# Patient Record
Sex: Female | Born: 1980 | Race: Black or African American | Hispanic: No | Marital: Married | Smoking: Never smoker
Health system: Southern US, Community
[De-identification: ages and names within clinical notes are randomized; demographics above are authoritative.]

## PROBLEM LIST (undated history)

## (undated) DIAGNOSIS — D219 Benign neoplasm of connective and other soft tissue, unspecified: Secondary | ICD-10-CM

## (undated) DIAGNOSIS — N970 Female infertility associated with anovulation: Secondary | ICD-10-CM

## (undated) DIAGNOSIS — E559 Vitamin D deficiency, unspecified: Secondary | ICD-10-CM

## (undated) DIAGNOSIS — E034 Atrophy of thyroid (acquired): Secondary | ICD-10-CM

## (undated) DIAGNOSIS — R87629 Unspecified abnormal cytological findings in specimens from vagina: Secondary | ICD-10-CM

## (undated) DIAGNOSIS — E049 Nontoxic goiter, unspecified: Secondary | ICD-10-CM

## (undated) DIAGNOSIS — D649 Anemia, unspecified: Secondary | ICD-10-CM

## (undated) HISTORY — DX: Nontoxic goiter, unspecified: E04.9

## (undated) HISTORY — DX: Female infertility associated with anovulation: N97.0

---

## 2012-10-07 ENCOUNTER — Other Ambulatory Visit: Payer: Self-pay | Admitting: Internal Medicine

## 2012-10-07 ENCOUNTER — Other Ambulatory Visit (INDEPENDENT_AMBULATORY_CARE_PROVIDER_SITE_OTHER): Payer: 59

## 2012-10-07 ENCOUNTER — Ambulatory Visit (INDEPENDENT_AMBULATORY_CARE_PROVIDER_SITE_OTHER): Payer: 59 | Admitting: Internal Medicine

## 2012-10-07 DIAGNOSIS — N390 Urinary tract infection, site not specified: Secondary | ICD-10-CM

## 2012-10-07 LAB — URINALYSIS, MICROSCOPIC ONLY
Casts: NONE SEEN
Crystals: NONE SEEN

## 2012-10-07 LAB — URINALYSIS, ROUTINE W REFLEX MICROSCOPIC
Bilirubin Urine: NEGATIVE
Nitrite: POSITIVE — AB
Protein, ur: >300 mg/dL — AB
Urobilinogen, UA: 1 mg/dL (ref 0.0–1.0)

## 2012-10-07 MED ORDER — PHENAZOPYRIDINE HCL 200 MG PO TABS: 200.0000 mg | ORAL_TABLET | Freq: Three times a day (TID) | ORAL | Status: DC | PRN

## 2012-10-07 MED ORDER — CIPROFLOXACIN HCL 250 MG PO TABS: 250.0000 mg | ORAL_TABLET | Freq: Two times a day (BID) | ORAL | Status: DC

## 2012-10-07 NOTE — Progress Notes (Signed)
  Subjective:    Patient ID: Kelsey Reynolds, female    DOB: April 13, 1980, 32 y.o.   MRN: [id number]  HPI  32 yo with no sig PMHx and no h/o UTI presents with 2 days of burning at the end of the urinary stream accompanied by hematuria and cloudy urine. No CVA tenderness / F/C, recent bladder catheterization, discharge. Recent travel two weeks ago to [place].   No PMHx  Meds include only OCP  Allergies are sulfa and PCN  Review of Systems     Objective:   Physical Exam        Assessment & Plan:

## 2012-10-07 NOTE — Assessment & Plan Note (Signed)
UA and micro confirm UTI. Due to travel outside of country, cx is ordered. Empiric tx with Cipro. Pyridium for sxs relief.

## 2012-10-11 ENCOUNTER — Encounter: Payer: 59 | Admitting: Internal Medicine

## 2012-10-14 ENCOUNTER — Other Ambulatory Visit: Payer: Self-pay | Admitting: Internal Medicine

## 2012-10-14 ENCOUNTER — Other Ambulatory Visit: Payer: 59

## 2012-10-14 DIAGNOSIS — N390 Urinary tract infection, site not specified: Secondary | ICD-10-CM

## 2012-10-14 LAB — URINALYSIS, ROUTINE W REFLEX MICROSCOPIC
Bilirubin Urine: NEGATIVE
Hgb urine dipstick: NEGATIVE
Ketones, ur: NEGATIVE mg/dL
Nitrite: NEGATIVE
Urobilinogen, UA: 1 mg/dL (ref 0.0–1.0)
pH: 6.5 (ref 5.0–8.0)

## 2012-10-14 LAB — URINALYSIS, MICROSCOPIC ONLY
Casts: NEGATIVE
Crystals: NEGATIVE

## 2012-10-14 MED ORDER — NITROFURANTOIN MONOHYD MACRO 100 MG PO CAPS: 100.0000 mg | ORAL_CAPSULE | Freq: Two times a day (BID) | ORAL | Status: DC

## 2012-10-14 NOTE — Addendum Note (Signed)
Addended by: Remus Blake on: 10/14/2012 01:27 PM   Modules accepted: Orders

## 2012-10-17 LAB — URINE CULTURE

## 2014-11-23 ENCOUNTER — Other Ambulatory Visit: Payer: Self-pay | Admitting: Otolaryngology

## 2014-11-23 DIAGNOSIS — E041 Nontoxic single thyroid nodule: Secondary | ICD-10-CM

## 2014-12-01 ENCOUNTER — Other Ambulatory Visit: Payer: Self-pay | Admitting: Internal Medicine

## 2014-12-01 DIAGNOSIS — E041 Nontoxic single thyroid nodule: Secondary | ICD-10-CM

## 2014-12-05 ENCOUNTER — Ambulatory Visit
Admission: RE | Admit: 2014-12-05 | Discharge: 2014-12-05 | Disposition: A | Discharge status: Home / Self Care | Payer: BLUE CROSS/BLUE SHIELD | Source: Ambulatory Visit | Attending: Internal Medicine | Admitting: Internal Medicine

## 2014-12-05 ENCOUNTER — Other Ambulatory Visit: Payer: 59

## 2014-12-05 DIAGNOSIS — E041 Nontoxic single thyroid nodule: Secondary | ICD-10-CM

## 2015-10-30 ENCOUNTER — Other Ambulatory Visit (HOSPITAL_COMMUNITY): Payer: Self-pay | Admitting: Obstetrics and Gynecology

## 2015-10-30 DIAGNOSIS — O034 Incomplete spontaneous abortion without complication: Secondary | ICD-10-CM

## 2015-10-31 ENCOUNTER — Ambulatory Visit (HOSPITAL_COMMUNITY)
Admission: RE | Admit: 2015-10-31 | Discharge: 2015-10-31 | Disposition: A | Discharge status: Home / Self Care | Payer: BLUE CROSS/BLUE SHIELD | Source: Ambulatory Visit | Attending: Obstetrics and Gynecology | Admitting: Obstetrics and Gynecology

## 2015-10-31 DIAGNOSIS — O034 Incomplete spontaneous abortion without complication: Secondary | ICD-10-CM | POA: Insufficient documentation

## 2015-10-31 DIAGNOSIS — D259 Leiomyoma of uterus, unspecified: Secondary | ICD-10-CM | POA: Insufficient documentation

## 2015-11-13 ENCOUNTER — Other Ambulatory Visit: Payer: BLUE CROSS/BLUE SHIELD

## 2015-11-14 ENCOUNTER — Other Ambulatory Visit: Payer: Self-pay | Admitting: Internal Medicine

## 2015-11-14 ENCOUNTER — Ambulatory Visit
Admission: RE | Admit: 2015-11-14 | Discharge: 2015-11-14 | Disposition: A | Discharge status: Home / Self Care | Payer: BLUE CROSS/BLUE SHIELD | Source: Ambulatory Visit | Attending: Internal Medicine | Admitting: Internal Medicine

## 2015-11-14 ENCOUNTER — Ambulatory Visit
Admission: RE | Admit: 2015-11-14 | Discharge: 2015-11-14 | Disposition: A | Discharge status: Home / Self Care | Payer: BLUE CROSS/BLUE SHIELD | Source: Ambulatory Visit | Attending: Otolaryngology | Admitting: Otolaryngology

## 2015-11-14 DIAGNOSIS — E041 Nontoxic single thyroid nodule: Secondary | ICD-10-CM

## 2016-10-24 ENCOUNTER — Inpatient Hospital Stay (HOSPITAL_COMMUNITY)
Admission: AD | Admit: 2016-10-24 | Discharge: 2016-10-26 | DRG: 770 | Disposition: A | Discharge status: Home / Self Care | Payer: BLUE CROSS/BLUE SHIELD | Source: Ambulatory Visit | Attending: Obstetrics and Gynecology | Admitting: Obstetrics and Gynecology

## 2016-10-24 ENCOUNTER — Encounter (HOSPITAL_COMMUNITY): Payer: Self-pay | Admitting: *Deleted

## 2016-10-24 ENCOUNTER — Inpatient Hospital Stay (HOSPITAL_COMMUNITY): Payer: BLUE CROSS/BLUE SHIELD

## 2016-10-24 DIAGNOSIS — O030 Genital tract and pelvic infection following incomplete spontaneous abortion: Principal | ICD-10-CM | POA: Diagnosis present

## 2016-10-24 DIAGNOSIS — Z880 Allergy status to penicillin: Secondary | ICD-10-CM | POA: Diagnosis not present

## 2016-10-24 DIAGNOSIS — R112 Nausea with vomiting, unspecified: Secondary | ICD-10-CM | POA: Diagnosis present

## 2016-10-24 DIAGNOSIS — O035 Genital tract and pelvic infection following complete or unspecified spontaneous abortion: Secondary | ICD-10-CM

## 2016-10-24 DIAGNOSIS — N939 Abnormal uterine and vaginal bleeding, unspecified: Secondary | ICD-10-CM

## 2016-10-24 HISTORY — DX: Benign neoplasm of connective and other soft tissue, unspecified: D21.9

## 2016-10-24 HISTORY — DX: Unspecified abnormal cytological findings in specimens from vagina: R87.629

## 2016-10-24 HISTORY — DX: Anemia, unspecified: D64.9

## 2016-10-24 LAB — CBC WITH DIFFERENTIAL/PLATELET
Basophils Absolute: 0 10*3/uL (ref 0.0–0.1)
Basophils Relative: 0 %
EOS ABS: 0 10*3/uL (ref 0.0–0.7)
EOS PCT: 0 %
HCT: 34 % — ABNORMAL LOW (ref 36.0–46.0)
HEMOGLOBIN: 11.5 g/dL — AB (ref 12.0–15.0)
LYMPHS ABS: 0.8 10*3/uL (ref 0.7–4.0)
Lymphocytes Relative: 5 %
MCH: 30.6 pg (ref 26.0–34.0)
MCHC: 33.8 g/dL (ref 30.0–36.0)
MCV: 90.4 fL (ref 78.0–100.0)
MONO ABS: 0.3 10*3/uL (ref 0.1–1.0)
Monocytes Relative: 2 %
NEUTROS PCT: 93 %
Neutro Abs: 14.8 10*3/uL — ABNORMAL HIGH (ref 1.7–7.7)
PLATELETS: 233 10*3/uL (ref 150–400)
RBC: 3.76 MIL/uL — ABNORMAL LOW (ref 3.87–5.11)
RDW: 12.3 % (ref 11.5–15.5)
WBC: 15.9 10*3/uL — ABNORMAL HIGH (ref 4.0–10.5)

## 2016-10-24 LAB — URINALYSIS, ROUTINE W REFLEX MICROSCOPIC
BILIRUBIN URINE: NEGATIVE
GLUCOSE, UA: NEGATIVE mg/dL
KETONES UR: 5 mg/dL — AB
Nitrite: NEGATIVE
PH: 6 (ref 5.0–8.0)
PROTEIN: 30 mg/dL — AB
Specific Gravity, Urine: 1.021 (ref 1.005–1.030)

## 2016-10-24 LAB — CREATININE, SERUM
Creatinine, Ser: 1.07 mg/dL — ABNORMAL HIGH (ref 0.44–1.00)
GFR calc non Af Amer: >60 mL/min (ref >60)

## 2016-10-24 LAB — WET PREP, GENITAL
Clue Cells Wet Prep HPF POC: NONE SEEN
Sperm: NONE SEEN
TRICH WET PREP: NONE SEEN
YEAST WET PREP: NONE SEEN

## 2016-10-24 LAB — POCT PREGNANCY, URINE: Preg Test, Ur: POSITIVE — AB

## 2016-10-24 LAB — HCG, QUANTITATIVE, PREGNANCY: hCG, Beta Chain, Quant, S: 149 m[IU]/mL — ABNORMAL HIGH (ref <5)

## 2016-10-24 MED ORDER — SODIUM CHLORIDE 0.9% FLUSH
3.0000 mL | Freq: Two times a day (BID) | INTRAVENOUS | Status: DC
  Administered 2016-10-24 – 2016-10-25 (×2): 3 mL via INTRAVENOUS

## 2016-10-24 MED ORDER — OXYCODONE-ACETAMINOPHEN 5-325 MG PO TABS: 1.0000 | ORAL_TABLET | Freq: Four times a day (QID) | ORAL | Status: DC | PRN

## 2016-10-24 MED ORDER — CLINDAMYCIN PHOSPHATE 900 MG/50ML IV SOLN: 900.0000 mg | Freq: Three times a day (TID) | INTRAVENOUS | Status: DC

## 2016-10-24 MED ORDER — ACETAMINOPHEN 325 MG PO TABS
650.0000 mg | ORAL_TABLET | ORAL | Status: DC | PRN
  Administered 2016-10-24 – 2016-10-25 (×2): 650 mg via ORAL
  Filled 2016-10-24 (×2): qty 2

## 2016-10-24 MED ORDER — GENTAMICIN SULFATE 40 MG/ML IJ SOLN
Freq: Every day | INTRAMUSCULAR | Status: DC
  Administered 2016-10-24 – 2016-10-25 (×2): via INTRAVENOUS
  Filled 2016-10-24 (×2): qty 11.25

## 2016-10-24 MED ORDER — PRENATAL MULTIVITAMIN CH
1.0000 | ORAL_TABLET | Freq: Every day | ORAL | Status: DC
  Administered 2016-10-25: 1 via ORAL
  Filled 2016-10-24: qty 1

## 2016-10-24 MED ORDER — LACTATED RINGERS IV BOLUS (SEPSIS)
1000.0000 mL | Freq: Once | INTRAVENOUS | Status: AC
  Administered 2016-10-24: 1000 mL via INTRAVENOUS

## 2016-10-24 MED ORDER — ONDANSETRON HCL 4 MG/2ML IJ SOLN: 4.0000 mg | Freq: Four times a day (QID) | INTRAMUSCULAR | Status: DC | PRN

## 2016-10-24 MED ORDER — SODIUM CHLORIDE 0.9% FLUSH: 3.0000 mL | INTRAVENOUS | Status: DC | PRN

## 2016-10-24 MED ORDER — CLINDAMYCIN PHOSPHATE 900 MG/50ML IV SOLN
900.0000 mg | INTRAVENOUS | Status: DC
  Administered 2016-10-24 – 2016-10-26 (×4): 900 mg via INTRAVENOUS
  Filled 2016-10-24 (×4): qty 50

## 2016-10-24 MED ORDER — SODIUM CHLORIDE 0.9 % IV SOLN
8.0000 mg | Freq: Once | INTRAVENOUS | Status: AC
  Administered 2016-10-24: 8 mg via INTRAVENOUS
  Filled 2016-10-24: qty 4

## 2016-10-24 MED ORDER — SODIUM CHLORIDE 0.9 % IV SOLN: 250.0000 mL | INTRAVENOUS | Status: DC | PRN

## 2016-10-24 MED ORDER — ACETAMINOPHEN 500 MG PO TABS
1000.0000 mg | ORAL_TABLET | Freq: Once | ORAL | Status: AC
  Administered 2016-10-24: 1000 mg via ORAL
  Filled 2016-10-24: qty 2

## 2016-10-24 MED ORDER — ONDANSETRON HCL 4 MG PO TABS
4.0000 mg | ORAL_TABLET | Freq: Four times a day (QID) | ORAL | Status: DC | PRN
  Administered 2016-10-25: 4 mg via ORAL
  Filled 2016-10-24: qty 1

## 2016-10-24 NOTE — MAU Provider Note (Signed)
History     CSN: [id number]  Arrival date and time: 10/24/16 6378   First Provider Initiated Contact with Patient 10/24/16 1033      Chief Complaint  Patient presents with  . Vaginal Bleeding  . Nausea   36 y.o G28P0020 female here with N/V, fever, and tachycardia since last night. Temp was 101 last night. She has not taken Tylenol. Denies abdominal or pelvic pain. No urinary sx. No respiratory sx. She was told of failed pregnancy at 8 weeks on 09/24/16 and took Cytotec on 6/30. She had increased bleeding after. She had another [place] about 2 weeks ago and was told retained POCs and she repeated the dose on 10/17/16. She is saturating through 5-8 pads per day, passing 2cm blood clots, and reports malodor.    OB History    Gravida Para Term Preterm AB Living   2 0 0 0 2 0   SAB TAB Ectopic Multiple Live Births   2              Past Medical History:  Diagnosis Date  . Anemia    with last miscarriage  . Fibroid    12 cm; with other small ones  . Vaginal Pap smear, abnormal    37 years old    No past surgical history on file.  No family history on file.  Social History  Substance Use Topics  . Smoking status: Not on file  . Smokeless tobacco: Not on file  . Alcohol use Not on file    Allergies:  Allergies  Allergen Reactions  . Penicillins   . Sulfa Antibiotics     Prescriptions Prior to Admission  Medication Sig Dispense Refill Last Dose  . nitrofurantoin, macrocrystal-monohydrate, (MACROBID) 100 MG capsule Take 1 capsule (100 mg total) by mouth 2 (two) times daily. 14 capsule 0     Review of Systems  Constitutional: Negative for chills and fever.  Gastrointestinal: Positive for nausea and vomiting. Negative for abdominal pain.  Genitourinary: Positive for vaginal discharge. Negative for pelvic pain.  Neurological: Positive for weakness.   Physical Exam   Blood pressure 115/72, pulse (!) 117, temperature 99.9 F (37.7 C), temperature source Oral, resp. rate  18, height 5' 10.47" (1.79 m), weight 141 lb (64 kg), SpO2 100 %.  Physical Exam  Constitutional: She is oriented to person, place, and time. She appears well-developed and well-nourished. No distress.  HENT:  Head: Normocephalic and atraumatic.  Neck: Normal range of motion.  Cardiovascular:  tachy  Respiratory: Effort normal. No respiratory distress.  GI: Soft. She exhibits no distension and no mass. There is no tenderness. There is no rebound and no guarding.  Genitourinary:  Genitourinary Comments: External: no lesions or erythema Vagina: rugated, pink, moist, moderate dark bloody discharge, ?POCs from os; attempted removal with ring forcep but coming in pieces and some remains in canal Uterus: + enlarged, anteverted, non tender, no CMT Adnexae: no masses, no tenderness left, no tenderness right   Musculoskeletal: Normal range of motion.  Neurological: She is alert and oriented to person, place, and time.  Skin: Skin is warm and dry.  Psychiatric: She has a normal mood and affect.   Results for orders placed or performed during the hospital encounter of 10/24/16 (from the past 24 hour(s))  Urinalysis, Routine w reflex microscopic     Status: Abnormal   Collection Time: 10/24/16  9:44 AM  Result Value Ref Range   Color, Urine YELLOW YELLOW   APPearance CLEAR  CLEAR   Specific Gravity, Urine 1.021 1.005 - 1.030   pH 6.0 5.0 - 8.0   Glucose, UA NEGATIVE NEGATIVE mg/dL   Hgb urine dipstick LARGE (A) NEGATIVE   Bilirubin Urine NEGATIVE NEGATIVE   Ketones, ur 5 (A) NEGATIVE mg/dL   Protein, ur 30 (A) NEGATIVE mg/dL   Nitrite NEGATIVE NEGATIVE   Leukocytes, UA MODERATE (A) NEGATIVE   RBC / HPF TOO NUMEROUS TO COUNT 0 - 5 RBC/hpf   WBC, UA 6-30 0 - 5 WBC/hpf   Bacteria, UA RARE (A) NONE SEEN   Squamous Epithelial / LPF 0-5 (A) NONE SEEN   Mucous PRESENT   Pregnancy, urine POC     Status: Abnormal   Collection Time: 10/24/16  9:56 AM  Result Value Ref Range   Preg Test, Ur  POSITIVE (A) NEGATIVE  Wet prep, genital     Status: Abnormal   Collection Time: 10/24/16 10:40 AM  Result Value Ref Range   Yeast Wet Prep HPF POC NONE SEEN NONE SEEN   Trich, Wet Prep NONE SEEN NONE SEEN   Clue Cells Wet Prep HPF POC NONE SEEN NONE SEEN   WBC, Wet Prep HPF POC FEW (A) NONE SEEN   Sperm NONE SEEN   CBC with Differential/Platelet     Status: Abnormal   Collection Time: 10/24/16 11:18 AM  Result Value Ref Range   WBC 15.9 (H) 4.0 - 10.5 K/uL   RBC 3.76 (L) 3.87 - 5.11 MIL/uL   Hemoglobin 11.5 (L) 12.0 - 15.0 g/dL   HCT 34.0 (L) 36.0 - 46.0 %   MCV 90.4 78.0 - 100.0 fL   MCH 30.6 26.0 - 34.0 pg   MCHC 33.8 30.0 - 36.0 g/dL   RDW 12.3 11.5 - 15.5 %   Platelets 233 150 - 400 K/uL   Neutrophils Relative % 93 %   Neutro Abs 14.8 (H) 1.7 - 7.7 K/uL   Lymphocytes Relative 5 %   Lymphs Abs 0.8 0.7 - 4.0 K/uL   Monocytes Relative 2 %   Monocytes Absolute 0.3 0.1 - 1.0 K/uL   Eosinophils Relative 0 %   Eosinophils Absolute 0.0 0.0 - 0.7 K/uL   Basophils Relative 0 %   Basophils Absolute 0.0 0.0 - 0.1 K/uL  hCG, quantitative, pregnancy     Status: Abnormal   Collection Time: 10/24/16 11:21 AM  Result Value Ref Range   hCG, Beta Chain, Quant, S 149 (H) <5 mIU/mL   US Ob Comp Less 14 Wks  Result Date: 10/24/2016 CLINICAL DATA:  Abnormal vaginal bleeding, early pregnancy, confirm spontaneous abortion at [facility] and given 2 doses of Cytotek, presenting with fever, nausea, vomiting and a heavy bleeding for 1 month ; quantitative beta HCG not yet available for correlation ; had an ultrasound performed at another facility for retained products EXAM: OBSTETRIC <14 WK [place] AND TRANSVAGINAL OB US TECHNIQUE: Both transabdominal and transvaginal ultrasound examinations were performed for complete evaluation of the gestation as well as the maternal uterus, adnexal regions, and pelvic cul-de-sac. Transvaginal technique was performed to assess early pregnancy. COMPARISON:   10/31/2015 FINDINGS: Intrauterine gestational sac: Absent Yolk sac:  N/A Embryo:  N/A Cardiac Activity: N/A Heart Rate: N/A  bpm Subchorionic hemorrhage:  N/A Maternal uterus/adnexae: Enlarged nodular uterus containing leiomyomata. Anterior leiomyoma 9.6 x 7.8 x 9.6 cm. Additional leiomyoma at LEFT lateral aspect of uterus, exophytic, 4.2 x 5.9 x 5.0 cm. Endometrial complex is obscured/distorted by the large uterine leiomyomata and not clearly delineated.  No definite endometrial fluid or evidence of a gestational sac. RIGHT ovary normal size and morphology, 3.8 x 2.4 x 3.0 cm. LEFT ovary not visualized likely obscured by bowel. No free pelvic fluid IMPRESSION: No intrauterine gestation identified. Findings are compatible with pregnancy of unknown location. Differential diagnosis includes early intrauterine pregnancy too early to visualize, spontaneous abortion, and ectopic pregnancy; no prior ultrasound for this pregnancy is available to confirm that the patient had a documented intrauterine pregnancy. If the patient has not documentation in at the current pregnancy was intrauterine, then quantitative beta HCG and or followup ultrasound would be recommended to definitively exclude ectopic pregnancy. Correlation with prior outside imaging recommended. Large uterine leiomyomata. Electronically Signed   By: Lavonia Dana M.D.   On: 10/24/2016 13:15   US Ob Transvaginal  Result Date: 10/24/2016 CLINICAL DATA:  Abnormal vaginal bleeding, early pregnancy, confirm spontaneous abortion at [facility] and given 2 doses of Cytotek, presenting with fever, nausea, vomiting and a heavy bleeding for 1 month ; quantitative beta HCG not yet available for correlation ; had an ultrasound performed at another facility for retained products EXAM: OBSTETRIC <14 WK [place] AND TRANSVAGINAL OB US TECHNIQUE: Both transabdominal and transvaginal ultrasound examinations were performed for complete evaluation of the gestation as well as  the maternal uterus, adnexal regions, and pelvic cul-de-sac. Transvaginal technique was performed to assess early pregnancy. COMPARISON:  10/31/2015 FINDINGS: Intrauterine gestational sac: Absent Yolk sac:  N/A Embryo:  N/A Cardiac Activity: N/A Heart Rate: N/A  bpm Subchorionic hemorrhage:  N/A Maternal uterus/adnexae: Enlarged nodular uterus containing leiomyomata. Anterior leiomyoma 9.6 x 7.8 x 9.6 cm. Additional leiomyoma at LEFT lateral aspect of uterus, exophytic, 4.2 x 5.9 x 5.0 cm. Endometrial complex is obscured/distorted by the large uterine leiomyomata and not clearly delineated. No definite endometrial fluid or evidence of a gestational sac. RIGHT ovary normal size and morphology, 3.8 x 2.4 x 3.0 cm. LEFT ovary not visualized likely obscured by bowel. No free pelvic fluid IMPRESSION: No intrauterine gestation identified. Findings are compatible with pregnancy of unknown location. Differential diagnosis includes early intrauterine pregnancy too early to visualize, spontaneous abortion, and ectopic pregnancy; no prior ultrasound for this pregnancy is available to confirm that the patient had a documented intrauterine pregnancy. If the patient has not documentation in at the current pregnancy was intrauterine, then quantitative beta HCG and or followup ultrasound would be recommended to definitively exclude ectopic pregnancy. Correlation with prior outside imaging recommended. Large uterine leiomyomata. Electronically Signed   By: Lavonia Dana M.D.   On: 10/24/2016 13:15   MAU Course  Procedures LR 1 L bolus Zofran Tylenol  MDM Labs and [place] ordered and reviewed.  1325: Presentation, clinical findings, labs/imaging discussed with Dr. Willis Modena. MD to come discuss management with patient.  Assessment and Plan  Endometritis following abortion Admit to [facility] per Dr. Karolee Ohs, CNM 10/24/2016, 10:50 AM

## 2016-10-24 NOTE — MAU Note (Signed)
Pt. Here due to vaginal bleeding, 5-6 pads a day. Nausea, vomiting one time this morning.  Fever of 101.4 last night, also increased HR 124, mild headache.  Ultrasound done last week for retained product (miscarriage 09/24/2016).

## 2016-10-24 NOTE — Progress Notes (Signed)
Pt temp of 103 and pulse 110. BP WNL. Pt complains of headache, rating it a 3 out of 10. Pt has moderate amount of bleeding and passed 1 nickel-sized clot. Dr. Willis Modena notified. See new orders. Will continue to monitor.

## 2016-10-24 NOTE — Progress Notes (Addendum)
ANTIBIOTIC CONSULT NOTE - INITIAL  Pharmacy Consult for Gentamicin Indication: endometritis following miscarriage  Allergies  Allergen Reactions  . Penicillins Hives    chilidhood reaction  Has patient had a PCN reaction causing immediate rash, facial/tongue/throat swelling, SOB or lightheadedness with hypotension: Yes Has patient had a PCN reaction causing severe rash involving mucus membranes or skin necrosis: No Has patient had a PCN reaction that required hospitalization: No Has patient had a PCN reaction occurring within the last 10 years: No If all of the above answers are "NO", then may proceed with Cephalosporin use.  . Sulfa Antibiotics     Patient Measurements: Height: 5\' 10"  (177.8 cm) Weight: 141 lb (64 kg) IBW/kg (Calculated) : 68.5   Vital Signs: Temp: 102.6 F (39.2 C) (07/20 1303) Temp Source: Oral (07/20 1303) BP: 127/74 (07/20 1303) Pulse Rate: 105 (07/20 1303)  Labs:  Recent Labs  10/24/16 1118  WBC 15.9*  HGB 11.5*  PLT 233   No results for input(s): GENTTROUGH, GENTPEAK, GENTRANDOM in the last 72 hours.   Microbiology: Recent Results (from the past 720 hour(s))  Wet prep, genital     Status: Abnormal   Collection Time: 10/24/16 10:40 AM  Result Value Ref Range Status   Yeast Wet Prep HPF POC NONE SEEN NONE SEEN Final   Trich, Wet Prep NONE SEEN NONE SEEN Final   Clue Cells Wet Prep HPF POC NONE SEEN NONE SEEN Final   WBC, Wet Prep HPF POC FEW (A) NONE SEEN Final    Comment: FEW BACTERIA SEEN   Sperm NONE SEEN  Final    Medications:  Clindamycin 900mg  IV every 8 hours.  Assessment: 36 y.o. female G2P0020 presenting with fever and malodorous bleeding with history of miscarriage at 8 weeks on 09/24/16.  [place] about 2 weeks ago showed retained POCs.      Goal of Therapy:  Dosing per [facility] Extended Interval Nomogram  Plan:  Gentamicin 450 mg IV daily, will check SCr at 1600 today.   Joetta Manners D 10/24/2016,2:13 PM

## 2016-10-24 NOTE — MAU Note (Signed)
Called Dr. Willis Modena, need pathology requisition in order to process specimen.

## 2016-10-24 NOTE — H&P (Signed)
History     CSN: [id number]  Arrival date and time: 10/24/16 3810   First Provider Initiated Contact with Patient 10/24/16 1033         Chief Complaint  Patient presents with  . Vaginal Bleeding  . Nausea   36 y.o G38P0020 female here with N/V, fever, and tachycardia since last night. Temp was 101 last night. She has not taken Tylenol. Denies abdominal or pelvic pain. No urinary sx. No respiratory sx. She was told of failed pregnancy at 8 weeks on 09/24/16 and took Cytotec on 6/30. She had increased bleeding after. She had another [place] about 2 weeks ago and was told retained POCs and she repeated the dose on 10/17/16. She is saturating through 5-8 pads per day, passing 2cm blood clots, and reports malodor.            OB History    Gravida Para Term Preterm AB Living   2 0 0 0 2 0   SAB TAB Ectopic Multiple Live Births   2                  Past Medical History:  Diagnosis Date  . Anemia    with last miscarriage  . Fibroid    12 cm; with other small ones  . Vaginal Pap smear, abnormal    36 years old    No past surgical history on file.  No family history on file.      Social History  Substance Use Topics  . Smoking status: Not on file  . Smokeless tobacco: Not on file  . Alcohol use Not on file    Allergies:      Allergies  Allergen Reactions  . Penicillins   . Sulfa Antibiotics            Prescriptions Prior to Admission  Medication Sig Dispense Refill Last Dose  . nitrofurantoin, macrocrystal-monohydrate, (MACROBID) 100 MG capsule Take 1 capsule (100 mg total) by mouth 2 (two) times daily. 14 capsule 0     Review of Systems  Constitutional: Negative for chills and fever.  Gastrointestinal: Positive for nausea and vomiting. Negative for abdominal pain.  Genitourinary: Positive for vaginal discharge. Negative for pelvic pain.  Neurological: Positive for weakness.   Physical Exam   Blood pressure 115/72, pulse (!)  117, temperature 99.9 F (37.7 C), temperature source Oral, resp. rate 18, height 5' 10.47" (1.79 m), weight 141 lb (64 kg), SpO2 100 %.  Physical Exam  Constitutional: She is oriented to person, place, and time. She appears well-developed and well-nourished. No distress.  HENT:  Head: Normocephalic and atraumatic.  Neck: Normal range of motion.  Cardiovascular:  tachy  Respiratory: Effort normal. No respiratory distress.  GI: Soft. She exhibits no distension and no mass. There is no tenderness. There is no rebound and no guarding.  Genitourinary:  Genitourinary Comments: External: no lesions or erythema Vagina: rugated, pink, moist, moderate dark bloody discharge, POC removed from cervix with ring forceps Uterus: slightly enlarged, anteverted, slightly tender, no CMT Adnexae: no masses, no tenderness left, no tenderness right   Musculoskeletal: Normal range of motion.  Neurological: She is alert and oriented to person, place, and time.  Skin: Skin is warm and dry.  Psychiatric: She has a normal mood and affect.        Results for orders placed or performed during the hospital encounter of 10/24/16 (from the past 24 hour(s))  Urinalysis, Routine w reflex microscopic  Status: Abnormal   Collection Time: 10/24/16  9:44 AM  Result Value Ref Range   Color, Urine YELLOW YELLOW   APPearance CLEAR CLEAR   Specific Gravity, Urine 1.021 1.005 - 1.030   pH 6.0 5.0 - 8.0   Glucose, UA NEGATIVE NEGATIVE mg/dL   Hgb urine dipstick LARGE (A) NEGATIVE   Bilirubin Urine NEGATIVE NEGATIVE   Ketones, ur 5 (A) NEGATIVE mg/dL   Protein, ur 30 (A) NEGATIVE mg/dL   Nitrite NEGATIVE NEGATIVE   Leukocytes, UA MODERATE (A) NEGATIVE   RBC / HPF TOO NUMEROUS TO COUNT 0 - 5 RBC/hpf   WBC, UA 6-30 0 - 5 WBC/hpf   Bacteria, UA RARE (A) NONE SEEN   Squamous Epithelial / LPF 0-5 (A) NONE SEEN   Mucous PRESENT   Pregnancy, urine POC     Status: Abnormal   Collection Time: 10/24/16   9:56 AM  Result Value Ref Range   Preg Test, Ur POSITIVE (A) NEGATIVE  Wet prep, genital     Status: Abnormal   Collection Time: 10/24/16 10:40 AM  Result Value Ref Range   Yeast Wet Prep HPF POC NONE SEEN NONE SEEN   Trich, Wet Prep NONE SEEN NONE SEEN   Clue Cells Wet Prep HPF POC NONE SEEN NONE SEEN   WBC, Wet Prep HPF POC FEW (A) NONE SEEN   Sperm NONE SEEN   CBC with Differential/Platelet     Status: Abnormal   Collection Time: 10/24/16 11:18 AM  Result Value Ref Range   WBC 15.9 (H) 4.0 - 10.5 K/uL   RBC 3.76 (L) 3.87 - 5.11 MIL/uL   Hemoglobin 11.5 (L) 12.0 - 15.0 g/dL   HCT 34.0 (L) 36.0 - 46.0 %   MCV 90.4 78.0 - 100.0 fL   MCH 30.6 26.0 - 34.0 pg   MCHC 33.8 30.0 - 36.0 g/dL   RDW 12.3 11.5 - 15.5 %   Platelets 233 150 - 400 K/uL   Neutrophils Relative % 93 %   Neutro Abs 14.8 (H) 1.7 - 7.7 K/uL   Lymphocytes Relative 5 %   Lymphs Abs 0.8 0.7 - 4.0 K/uL   Monocytes Relative 2 %   Monocytes Absolute 0.3 0.1 - 1.0 K/uL   Eosinophils Relative 0 %   Eosinophils Absolute 0.0 0.0 - 0.7 K/uL   Basophils Relative 0 %   Basophils Absolute 0.0 0.0 - 0.1 K/uL  hCG, quantitative, pregnancy     Status: Abnormal   Collection Time: 10/24/16 11:21 AM  Result Value Ref Range   hCG, Beta Chain, Quant, S 149 (H) <5 mIU/mL   US Ob Comp Less 14 Wks  Result Date: 10/24/2016 CLINICAL DATA:  Abnormal vaginal bleeding, early pregnancy, confirm spontaneous abortion at [facility] and given 2 doses of Cytotek, presenting with fever, nausea, vomiting and a heavy bleeding for 1 month ; quantitative beta HCG not yet available for correlation ; had an ultrasound performed at another facility for retained products EXAM: OBSTETRIC <14 WK [place] AND TRANSVAGINAL OB US TECHNIQUE: Both transabdominal and transvaginal ultrasound examinations were performed for complete evaluation of the gestation as well as the maternal uterus, adnexal regions, and pelvic cul-de-sac.  Transvaginal technique was performed to assess early pregnancy. COMPARISON:  10/31/2015 FINDINGS: Intrauterine gestational sac: Absent Yolk sac:  N/A Embryo:  N/A Cardiac Activity: N/A Heart Rate: N/A  bpm Subchorionic hemorrhage:  N/A Maternal uterus/adnexae: Enlarged nodular uterus containing leiomyomata. Anterior leiomyoma 9.6 x 7.8 x 9.6 cm. Additional leiomyoma at  LEFT lateral aspect of uterus, exophytic, 4.2 x 5.9 x 5.0 cm. Endometrial complex is obscured/distorted by the large uterine leiomyomata and not clearly delineated. No definite endometrial fluid or evidence of a gestational sac. RIGHT ovary normal size and morphology, 3.8 x 2.4 x 3.0 cm. LEFT ovary not visualized likely obscured by bowel. No free pelvic fluid IMPRESSION: No intrauterine gestation identified. Findings are compatible with pregnancy of unknown location. Differential diagnosis includes early intrauterine pregnancy too early to visualize, spontaneous abortion, and ectopic pregnancy; no prior ultrasound for this pregnancy is available to confirm that the patient had a documented intrauterine pregnancy. If the patient has not documentation in at the current pregnancy was intrauterine, then quantitative beta HCG and or followup ultrasound would be recommended to definitively exclude ectopic pregnancy. Correlation with prior outside imaging recommended. Large uterine leiomyomata. Electronically Signed   By: Lavonia Dana M.D.   On: 10/24/2016 13:15    Assessment and Plan  Endometritis following abortion Admit to [place] and [facility] since allergic to PCN, will follow temp and monitor bleeding

## 2016-10-24 NOTE — MAU Note (Signed)
Pt. Stable, transferred via WC to hi-risk OB on 3rd floor, report given to nurse. Spouse at the bedside.

## 2016-10-25 LAB — CBC WITH DIFFERENTIAL/PLATELET
BASOS PCT: 0 %
Basophils Absolute: 0 10*3/uL (ref 0.0–0.1)
EOS ABS: 0 10*3/uL (ref 0.0–0.7)
Eosinophils Relative: 0 %
HCT: 31.3 % — ABNORMAL LOW (ref 36.0–46.0)
HEMOGLOBIN: 10.7 g/dL — AB (ref 12.0–15.0)
Lymphocytes Relative: 9 %
Lymphs Abs: 0.7 10*3/uL (ref 0.7–4.0)
MCH: 31 pg (ref 26.0–34.0)
MCHC: 34.2 g/dL (ref 30.0–36.0)
MCV: 90.7 fL (ref 78.0–100.0)
MONOS PCT: 4 %
Monocytes Absolute: 0.3 10*3/uL (ref 0.1–1.0)
NEUTROS PCT: 87 %
Neutro Abs: 6.3 10*3/uL (ref 1.7–7.7)
Platelets: 187 10*3/uL (ref 150–400)
RBC: 3.45 MIL/uL — AB (ref 3.87–5.11)
RDW: 12.4 % (ref 11.5–15.5)
WBC: 7.2 10*3/uL (ref 4.0–10.5)

## 2016-10-25 MED ORDER — IBUPROFEN 600 MG PO TABS: 600.0000 mg | ORAL_TABLET | Freq: Four times a day (QID) | ORAL | Status: DC | PRN

## 2016-10-25 MED ORDER — METHYLERGONOVINE MALEATE 0.2 MG PO TABS
0.2000 mg | ORAL_TABLET | Freq: Three times a day (TID) | ORAL | Status: DC
  Administered 2016-10-25 (×3): 0.2 mg via ORAL
  Filled 2016-10-25 (×3): qty 1

## 2016-10-25 MED ORDER — DOXYCYCLINE HYCLATE 100 MG PO TABS
100.0000 mg | ORAL_TABLET | Freq: Two times a day (BID) | ORAL | Status: DC
  Administered 2016-10-25 (×2): 100 mg via ORAL
  Filled 2016-10-25 (×3): qty 1

## 2016-10-25 NOTE — Progress Notes (Signed)
Patient ID: Kelsey Reynolds, female   DOB: 1980/06/30, 36 y.o.   MRN: [id number] HD #2 post-SAB endometritis Feeling a little better, still bleeding-soaks a pad every 3-4 hrs, some dollar size clots Tmax 103 as 2250, 100.3 this am, VSS Abd- soft, NT CBC    Component Value Date/Time   WBC 7.2 10/25/2016 0610   RBC 3.45 (L) 10/25/2016 0610   HGB 10.7 (L) 10/25/2016 0610   HCT 31.3 (L) 10/25/2016 0610   PLT 187 10/25/2016 0610   MCV 90.7 10/25/2016 0610   MCH 31.0 10/25/2016 0610   MCHC 34.2 10/25/2016 0610   RDW 12.4 10/25/2016 0610   LYMPHSABS 0.7 10/25/2016 0610   MONOABS 0.3 10/25/2016 0610   EOSABS 0.0 10/25/2016 0610   BASOSABS 0.0 10/25/2016 0610   Will continue Gent and Clinda, add PO Doxycycline, add PO Methergine to help with bleeding and try to make sure everything is out of the uterus.  The u/s was not very helpful for being able to tell about retained POC due to her myomas, but I was able to remove a moderate size piece of products from cervix yesterday that made me feel like that was most, if not all, of the remaining POC.  Discussed we may still need to do D&E if her bleeding and fever do not improve. Discussed that optimally would continue IV antibiotics until afebrile for at least 24 hours.

## 2016-10-26 ENCOUNTER — Encounter (HOSPITAL_COMMUNITY)
Admission: AD | Disposition: A | Discharge status: Home / Self Care | Payer: Self-pay | Source: Ambulatory Visit | Attending: Obstetrics and Gynecology

## 2016-10-26 ENCOUNTER — Inpatient Hospital Stay (HOSPITAL_COMMUNITY): Payer: BLUE CROSS/BLUE SHIELD | Admitting: Anesthesiology

## 2016-10-26 HISTORY — PX: DILATION AND EVACUATION: SHX1459

## 2016-10-26 SURGERY — DILATION AND EVACUATION, UTERUS
Anesthesia: General | Site: Vagina

## 2016-10-26 MED ORDER — PROPOFOL 10 MG/ML IV BOLUS
INTRAVENOUS | Status: AC
  Filled 2016-10-26: qty 40

## 2016-10-26 MED ORDER — LIDOCAINE HCL (CARDIAC) 20 MG/ML IV SOLN
INTRAVENOUS | Status: DC | PRN
  Administered 2016-10-26: 50 mg via INTRAVENOUS

## 2016-10-26 MED ORDER — CLINDAMYCIN HCL 300 MG PO CAPS: 300.0000 mg | ORAL_CAPSULE | Freq: Three times a day (TID) | ORAL | 0 refills | Status: DC

## 2016-10-26 MED ORDER — FENTANYL CITRATE (PF) 100 MCG/2ML IJ SOLN
INTRAMUSCULAR | Status: AC
  Filled 2016-10-26: qty 4

## 2016-10-26 MED ORDER — DOXYCYCLINE HYCLATE 100 MG PO TABS: 100.0000 mg | ORAL_TABLET | Freq: Two times a day (BID) | ORAL | 0 refills | Status: DC

## 2016-10-26 MED ORDER — MIDAZOLAM HCL 2 MG/2ML IJ SOLN
INTRAMUSCULAR | Status: AC
  Filled 2016-10-26: qty 2

## 2016-10-26 MED ORDER — DEXAMETHASONE SODIUM PHOSPHATE 10 MG/ML IJ SOLN
INTRAMUSCULAR | Status: AC
  Filled 2016-10-26: qty 1

## 2016-10-26 MED ORDER — MIDAZOLAM HCL 2 MG/2ML IJ SOLN
INTRAMUSCULAR | Status: DC | PRN
  Administered 2016-10-26: 2 mg via INTRAVENOUS

## 2016-10-26 MED ORDER — LIDOCAINE HCL (CARDIAC) 20 MG/ML IV SOLN
INTRAVENOUS | Status: AC
  Filled 2016-10-26: qty 5

## 2016-10-26 MED ORDER — MEPERIDINE HCL 25 MG/ML IJ SOLN: 6.2500 mg | INTRAMUSCULAR | Status: DC | PRN

## 2016-10-26 MED ORDER — PROPOFOL 10 MG/ML IV BOLUS
INTRAVENOUS | Status: DC | PRN
  Administered 2016-10-26: 160 mg via INTRAVENOUS

## 2016-10-26 MED ORDER — DEXAMETHASONE SODIUM PHOSPHATE 10 MG/ML IJ SOLN
INTRAMUSCULAR | Status: DC | PRN
  Administered 2016-10-26: 10 mg via INTRAVENOUS

## 2016-10-26 MED ORDER — FENTANYL CITRATE (PF) 100 MCG/2ML IJ SOLN: 25.0000 ug | INTRAMUSCULAR | Status: DC | PRN

## 2016-10-26 MED ORDER — ONDANSETRON HCL 4 MG/2ML IJ SOLN
INTRAMUSCULAR | Status: AC
  Filled 2016-10-26: qty 2

## 2016-10-26 MED ORDER — ONDANSETRON HCL 4 MG/2ML IJ SOLN: 4.0000 mg | Freq: Once | INTRAMUSCULAR | Status: DC | PRN

## 2016-10-26 MED ORDER — GLYCOPYRROLATE 0.2 MG/ML IJ SOLN
INTRAMUSCULAR | Status: AC
  Filled 2016-10-26: qty 1

## 2016-10-26 MED ORDER — LACTATED RINGERS IV SOLN
INTRAVENOUS | Status: DC | PRN
  Administered 2016-10-26: 11:00:00 via INTRAVENOUS

## 2016-10-26 MED ORDER — FENTANYL CITRATE (PF) 100 MCG/2ML IJ SOLN
INTRAMUSCULAR | Status: DC | PRN
  Administered 2016-10-26 (×2): 50 ug via INTRAVENOUS

## 2016-10-26 MED ORDER — ONDANSETRON HCL 4 MG/2ML IJ SOLN
INTRAMUSCULAR | Status: DC | PRN
  Administered 2016-10-26: 4 mg via INTRAVENOUS

## 2016-10-26 MED ORDER — METHYLERGONOVINE MALEATE 0.2 MG PO TABS: 0.2000 mg | ORAL_TABLET | Freq: Three times a day (TID) | ORAL | 0 refills | Status: DC

## 2016-10-26 SURGICAL SUPPLY — 19 items
CATH ROBINSON RED A/P 16FR (CATHETERS) ×2 IMPLANT
CLOTH BEACON ORANGE TIMEOUT ST (SAFETY) ×2 IMPLANT
DECANTER SPIKE VIAL GLASS SM (MISCELLANEOUS) ×2 IMPLANT
GLOVE BIO SURGEON STRL SZ8 (GLOVE) ×2 IMPLANT
GLOVE BIOGEL PI IND STRL 7.0 (GLOVE) ×1 IMPLANT
GLOVE BIOGEL PI INDICATOR 7.0 (GLOVE) ×1
GLOVE ORTHO TXT STRL SZ7.5 (GLOVE) ×2 IMPLANT
GOWN STRL REUS W/TWL LRG LVL3 (GOWN DISPOSABLE) ×4 IMPLANT
KIT BERKELEY 1ST TRIMESTER 3/8 (MISCELLANEOUS) ×2 IMPLANT
NS IRRIG 1000ML POUR BTL (IV SOLUTION) ×2 IMPLANT
PACK VAGINAL MINOR WOMEN LF (CUSTOM PROCEDURE TRAY) ×2 IMPLANT
PAD OB MATERNITY 4.3X12.25 (PERSONAL CARE ITEMS) ×2 IMPLANT
PAD PREP 24X48 CUFFED NSTRL (MISCELLANEOUS) ×2 IMPLANT
SET BERKELEY SUCTION TUBING (SUCTIONS) ×2 IMPLANT
TOWEL OR 17X24 6PK STRL BLUE (TOWEL DISPOSABLE) ×4 IMPLANT
VACURETTE 10 RIGID CVD (CANNULA) IMPLANT
VACURETTE 7MM CVD STRL WRAP (CANNULA) ×2 IMPLANT
VACURETTE 8 RIGID CVD (CANNULA) IMPLANT
VACURETTE 9 RIGID CVD (CANNULA) IMPLANT

## 2016-10-26 NOTE — Anesthesia Preprocedure Evaluation (Addendum)
Anesthesia Evaluation  Patient identified by MRN, date of birth, ID band Patient awake    Reviewed: Allergy & Precautions, H&P , NPO status , Patient's Chart, lab work & pertinent test results, reviewed documented beta blocker date and time   Airway Mallampati: I  TM Distance: >3 FB Neck ROM: full    Dental no notable dental hx.    Pulmonary neg pulmonary ROS,    Pulmonary exam normal breath sounds clear to auscultation       Cardiovascular Exercise Tolerance: Good negative cardio ROS   Rhythm:regular Rate:Normal     Neuro/Psych negative neurological ROS  negative psych ROS   GI/Hepatic negative GI ROS, Neg liver ROS,   Endo/Other  negative endocrine ROS  Renal/GU negative Renal ROS  negative genitourinary   Musculoskeletal   Abdominal   Peds  Hematology negative hematology ROS (+)   Anesthesia Other Findings   Reproductive/Obstetrics negative OB ROS                            Anesthesia Physical Anesthesia Plan  ASA: I and emergent  Anesthesia Plan: General and General LMA   Post-op Pain Management:    Induction:   PONV Risk Score and Plan: 2 and Ondansetron, Dexamethasone and Treatment may vary due to age or medical condition  Airway Management Planned: LMA  Additional Equipment:   Intra-op Plan:   Post-operative Plan:   Informed Consent: I have reviewed the patients History and Physical, chart, labs and discussed the procedure including the risks, benefits and alternatives for the proposed anesthesia with the patient or authorized representative who has indicated his/her understanding and acceptance.   Dental Advisory Given  Plan Discussed with: CRNA  Anesthesia Plan Comments:        Anesthesia Quick Evaluation

## 2016-10-26 NOTE — Discharge Summary (Signed)
Physician Discharge Summary  Patient ID: Kelsey Reynolds MRN: [id number] DOB/AGE: 1980/12/21 36 y.o.  Admit date: 10/24/2016 Discharge date: 10/26/2016  Admission Diagnoses:  Endometritis s/p SAB  Discharge Diagnoses: Same Active Problems:   Endometritis following abortive pregnancy   Discharged Condition: good  Hospital Course: Pt seen in MAU with low grade fever, leukocytosis, vaginal bleeding and retained products in cervix.  Ultrasound with 2 large myomas making it difficult to evaluate endometrium.  POC removed from cervix, pt admitted and put on [place] and Doxy.  She had fever to 103 on day of admission, 102 on HD #2 but afebrile after that.  She continued to have some bleeding and was placed on PO Methergine, passed some tissue HD #2.  On morning of 7-22, she had been afebrile for almost 24 hours.  She was still bleeding some and there was concern for still retained POC.  She underwent D&E with removal of mostly blood and a small amount of tissue, stable for discharge home after procedure.   Discharge Exam: Blood pressure 108/70, pulse 84, temperature 98.3 F (36.8 C), resp. rate 16, height 5\' 10"  (1.778 m), weight 64 kg (141 lb), SpO2 97 %. General appearance: alert  Disposition: 01-Home or Self Care  Discharge Instructions    Call MD for:  difficulty breathing, headache or visual disturbances    Complete by:  As directed    Call MD for:  persistant dizziness or light-headedness    Complete by:  As directed    Call MD for:  persistant nausea and vomiting    Complete by:  As directed    Call MD for:  severe uncontrolled pain    Complete by:  As directed    Call MD for:  temperature >100.4    Complete by:  As directed    Diet - low sodium heart healthy    Complete by:  As directed    Increase activity slowly    Complete by:  As directed      Allergies as of 10/26/2016      Reactions   Penicillins Hives   chilidhood reaction Has patient had a PCN  reaction causing immediate rash, facial/tongue/throat swelling, SOB or lightheadedness with hypotension: Yes Has patient had a PCN reaction causing severe rash involving mucus membranes or skin necrosis: No Has patient had a PCN reaction that required hospitalization: No Has patient had a PCN reaction occurring within the last 10 years: No If all of the above answers are "NO", then may proceed with Cephalosporin use.   Sulfa Antibiotics       Medication List    TAKE these medications   clindamycin 300 MG capsule Commonly known as:  CLEOCIN Take 1 capsule (300 mg total) by mouth 3 (three) times daily.   doxycycline 100 MG tablet Commonly known as:  VIBRA-TABS Take 1 tablet (100 mg total) by mouth 2 (two) times daily.   methylergonovine 0.2 MG tablet Commonly known as:  METHERGINE Take 1 tablet (0.2 mg total) by mouth 3 (three) times daily.   Vitamin D3 50000 units Caps Take 1 capsule by mouth once a week.      Follow-up Information    Symeon Puleo, MD. Schedule an appointment as soon as possible for a visit in 1 week.   Specialty:  Obstetrics and Gynecology Contact information: [address], [place] [address] [place] [address] [phone]           Signed: Blane Ohara Gareth Fitzner 10/26/2016, 10:19  PM

## 2016-10-26 NOTE — Progress Notes (Signed)
Patient states she received RhoGram in the Dr's office July 11,2018

## 2016-10-26 NOTE — Transfer of Care (Signed)
Immediate Anesthesia Transfer of Care Note  Patient: Kelsey Reynolds  Procedure(s) Performed: Procedure(s): DILATATION AND EVACUATION (N/A)  Patient Location: PACU  Anesthesia Type:General  Level of Consciousness: awake, alert  and oriented  Airway & Oxygen Therapy: Patient Spontanous Breathing and Patient connected to nasal cannula oxygen  Post-op Assessment: Report given to RN and Post -op Vital signs reviewed and stable  Post vital signs: Reviewed and stable  Last Vitals:  Vitals:   10/26/16 0420 10/26/16 0804  BP: 100/63 102/67  Pulse: 83 87  Resp: 18 16  Temp: 36.9 C 36.9 C    Last Pain:  Vitals:   10/26/16 0804  TempSrc: Oral  PainSc:       Patients Stated Pain Goal: 2 (53/64/68 0321)  Complications: No apparent anesthesia complications

## 2016-10-26 NOTE — Progress Notes (Signed)
Patient ID: Kelsey Reynolds, female   DOB: [birth year], 36 y.o.   MRN: [id number] HD#3, endometritis s/p incomplete SAB Feeling ok but passed more tissue last night and still having bleeding Tmax 102.1 at 0952, afebrile almost 24 hrs, VSS Abd- soft VE-os still open, small clot removed, feels like there could still be some tissue in cervix Discussed with pt, overall course improved on Gent/Clinda/doxy and Methergine but may still have some retained POC.  Recommended D&E and she is agreeable.  Discussed procedure and risks, OR notified.

## 2016-10-26 NOTE — Progress Notes (Signed)
Pt consent signed for Dilation and Evacuation of retained product, A new IV was started on the Left Forearm with the previous IV being discontinued due to discomfort. CHG bath completed. SCD's placed and patient transferred to OR area when called.

## 2016-10-26 NOTE — Discharge Instructions (Signed)
DISCHARGE INSTRUCTIONS: D&C / D&E The following instructions have been prepared to help you care for yourself upon your return home.   Personal hygiene:  Use sanitary pads for vaginal drainage, not tampons.  Shower the day after your procedure.  NO tub baths, pools or Jacuzzis for 2-3 weeks.  Wipe front to back after using the bathroom.  Activity and limitations:  Do NOT drive or operate any equipment for 24 hours. The effects of anesthesia are still present and drowsiness may result.  Do NOT rest in bed all day.  Walking is encouraged.  Walk up and down stairs slowly.  You may resume your normal activity in one to two days or as indicated by your physician.  Sexual activity: NO intercourse for at least 2 weeks after the procedure, or as indicated by your physician.  Diet: Eat a light meal as desired this evening. You may resume your usual diet tomorrow.  Return to work: You may resume your work activities in one to two days or as indicated by your doctor.  What to expect after your surgery: Expect to have vaginal bleeding/discharge for 2-3 days and spotting for up to 10 days. It is not unusual to have soreness for up to 1-2 weeks. You may have a slight burning sensation when you urinate for the first day. Mild cramps may continue for a couple of days. You may have a regular period in 2-6 weeks.  Call your doctor for any of the following:  Excessive vaginal bleeding, saturating and changing one pad every hour.  Inability to urinate 6 hours after discharge from hospital.  Pain not relieved by pain medication.  Fever of 100.4 F or greater.  Unusual vaginal discharge or odor.   Call for an appointment:    Patients signature: ______________________  Nurses signature ________________________  Support person's signature_______________________    Post Anesthesia Home Care Instructions  Activity: Get plenty of rest for the remainder of the day. A responsible  individual must stay with you for 24 hours following the procedure.  For the next 24 hours, DO NOT: -Drive a car -Paediatric nurse -Drink alcoholic beverages -Take any medication unless instructed by your physician -Make any legal decisions or sign important papers.  Meals: Start with liquid foods such as gelatin or soup. Progress to regular foods as tolerated. Avoid greasy, spicy, heavy foods. If nausea and/or vomiting occur, drink only clear liquids until the nausea and/or vomiting subsides. Call your physician if vomiting continues.  Special Instructions/Symptoms: Your throat may feel dry or sore from the anesthesia or the breathing tube placed in your throat during surgery. If this causes discomfort, gargle with warm salt water. The discomfort should disappear within 24 hours.  If you had a scopolamine patch placed behind your ear for the management of post- operative nausea and/or vomiting:  1. The medication in the patch is effective for 72 hours, after which it should be removed.  Wrap patch in a tissue and discard in the trash. Wash hands thoroughly with soap and water. 2. You may remove the patch earlier than 72 hours if you experience unpleasant side effects which may include dry mouth, dizziness or visual disturbances. 3. Avoid touching the patch. Wash your hands with soap and water after contact with the patch.   Routine instructions for D&E

## 2016-10-26 NOTE — Anesthesia Procedure Notes (Signed)
Procedure Name: LMA Insertion Date/Time: 10/26/2016 11:34 AM Performed by: Rayvon Char Pre-anesthesia Checklist: Patient identified, Emergency Drugs available, Suction available and Patient being monitored Patient Re-evaluated:Patient Re-evaluated prior to induction Oxygen Delivery Method: Circle system utilized Preoxygenation: Pre-oxygenation with 100% oxygen Induction Type: IV induction Ventilation: Mask ventilation without difficulty LMA: LMA inserted LMA Size: 3.0 Number of attempts: 1

## 2016-10-26 NOTE — Op Note (Signed)
  Preoperative Diagnosis:  Endometritis with incomplete SAB Postop Diagnosis:  Same Procedure:  D&E Anesthesia:  Gen with LMA Findings: Cervix was 1 cm dilated, uterus was enlarged and irregular with myomas, minimal tissue obtained Specimens: Products of conception sent for routine pathology Estimated blood loss: Minimal Complications: None  Procedure in detail: The patient was taken to the operating room and placed in the dorsosupine position. General anesthesia was induced and she was placed and mobile stirrups. Perineum and vagina were prepped and draped in the usual sterile fashion, bladder drained with a red Robinson catheter. A Graves speculum was inserted in the vagina. The anterior lip of the cervix was grasped with a single-tooth tenaculum.  Cervix was gradually easily dilated to size 25 dilator. A size 7 curved suction curet was then inserted without difficulty. Suction curettage was return was performed with return of minimal products of conception. Sharp curettage was performed with which revealed good uterine cry in all quadrants and no significant tissue. Suction curettage was performed one more time which revealed minimal blood. The single-tooth tenaculum was removed from the cervix and bleeding was controlled with pressure. All instruments were removed from the vagina. The patient was taken to the recovery in stable condition after tolerating the procedure well. Counts were correct, and she had PAS hose on throughout the procedure.

## 2016-10-26 NOTE — Anesthesia Postprocedure Evaluation (Signed)
Anesthesia Post Note  Patient: Kelsey Reynolds  Procedure(s) Performed: Procedure(s) (LRB): DILATATION AND EVACUATION (N/A)     Patient location during evaluation: PACU Anesthesia Type: General Level of consciousness: awake and alert Pain management: pain level controlled Vital Signs Assessment: post-procedure vital signs reviewed and stable Respiratory status: spontaneous breathing, nonlabored ventilation, respiratory function stable and patient connected to nasal cannula oxygen Cardiovascular status: blood pressure returned to baseline and stable Postop Assessment: no signs of nausea or vomiting Anesthetic complications: no    Last Vitals:  Vitals:   10/26/16 0420 10/26/16 0804  BP: 100/63 102/67  Pulse: 83 87  Resp: 18 16  Temp: 36.9 C 36.9 C    Last Pain:  Vitals:   10/26/16 0804  TempSrc: Oral  PainSc:    Pain Goal: Patients Stated Pain Goal: 2 (10/24/16 2300)               [name]

## 2016-10-27 ENCOUNTER — Encounter (HOSPITAL_COMMUNITY): Payer: Self-pay | Admitting: Obstetrics and Gynecology

## 2016-10-27 LAB — GC/CHLAMYDIA PROBE AMP (~~LOC~~) NOT AT ARMC
Chlamydia: NEGATIVE
Neisseria Gonorrhea: NEGATIVE

## 2016-10-27 NOTE — Addendum Note (Signed)
Addendum  created 10/27/16 1455 by Lucretia Kern D, CRNA   Charge Capture section accepted

## 2016-10-28 LAB — TYPE AND SCREEN
ABO/RH(D): A NEG
ANTIBODY SCREEN: POSITIVE
DAT, IGG: NEGATIVE
UNIT DIVISION: 0
Unit division: 0

## 2016-10-28 LAB — BPAM RBC
BLOOD PRODUCT EXPIRATION DATE: 201808012359
Blood Product Expiration Date: 201808072359
Unit Type and Rh: 9500
Unit Type and Rh: 9500

## 2016-10-30 LAB — CULTURE, BLOOD (ROUTINE X 2)
CULTURE: NO GROWTH
Culture: NO GROWTH
SPECIAL REQUESTS: ADEQUATE
Special Requests: ADEQUATE

## 2016-12-18 ENCOUNTER — Other Ambulatory Visit: Payer: Self-pay | Admitting: Obstetrics and Gynecology

## 2016-12-19 NOTE — H&P (Signed)
Kelsey Reynolds is a 36 y.o. female , originally referred to me by Dr. Paula Compton , for Gel-port assisted myomectomy and possible hysteroscopy.  She was diagnosed with fibroids because of abnormal uterine bleeding. She had an MRI showing 4 large fibroids.  She developed breakthrough bleeding and had to stop the pills.  She has been having monthly periods but with heavy flow and prolonged duration.  Patient would like to preserve her childbearing potential.  Pertinent Gynecological History: Menses: flow is excessive with use of 3 pads or tampons on heaviest days Bleeding: dysfunctional uterine bleeding Contraception: none DES exposure: denies Blood transfusions: none Sexually transmitted diseases: no past history Previous GYN Procedures: LEEP  Last mammogram: normal Last pap: normal  OB History: G1P001   Menstrual History: Menarche age: 21 No LMP recorded.    Past Medical History:  Diagnosis Date  . Anemia    with last miscarriage  . Fibroid    12 cm; with other small ones  . Vaginal Pap smear, abnormal    36 years old                    Past Surgical History:  Procedure Laterality Date  . DILATION AND EVACUATION N/A 10/26/2016   Procedure: DILATATION AND EVACUATION;  Surgeon: Cheri Fowler, MD;  Location: [facility];  Service: Gynecology;  Laterality: N/A;             No family history on file. No hereditary disease.  No cancer of breast, ovary, uterus. No cutaneous leiomyomatosis or renal cell carcinoma.  Social History   Social History  . Marital status: Married    Spouse name: N/A  . Number of children: N/A  . Years of education: N/A   Occupational History  . Not on file.   Social History Main Topics  . Smoking status: Not on file  . Smokeless tobacco: Not on file  . Alcohol use Not on file  . Drug use: Unknown  . Sexual activity: Not on file   Other Topics Concern  . Not on file   Social History Narrative  . No narrative on file     Allergies  Allergen Reactions  . Penicillins Hives    chilidhood reaction  Has patient had a PCN reaction causing immediate rash, facial/tongue/throat swelling, SOB or lightheadedness with hypotension: Yes Has patient had a PCN reaction causing severe rash involving mucus membranes or skin necrosis: No Has patient had a PCN reaction that required hospitalization: No Has patient had a PCN reaction occurring within the last 10 years: No If all of the above answers are "NO", then may proceed with Cephalosporin use.  . Sulfa Antibiotics     No current facility-administered medications on file prior to encounter.    Current Outpatient Prescriptions on File Prior to Encounter  Medication Sig Dispense Refill  . Cholecalciferol (VITAMIN D3) 50000 units CAPS Take 1 capsule by mouth once a week.  1  . clindamycin (CLEOCIN) 300 MG capsule Take 1 capsule (300 mg total) by mouth 3 (three) times daily. 18 capsule 0  . doxycycline (VIBRA-TABS) 100 MG tablet Take 1 tablet (100 mg total) by mouth 2 (two) times daily. 12 tablet 0  . methylergonovine (METHERGINE) 0.2 MG tablet Take 1 tablet (0.2 mg total) by mouth 3 (three) times daily. 6 tablet 0     Review of Systems  Constitutional: Negative.   HENT: Negative.   Eyes: Negative.   Respiratory: Negative.   Cardiovascular: Negative.  Gastrointestinal: Negative.   Genitourinary: Negative.   Musculoskeletal: Negative.   Skin: Negative.   Neurological: Negative.   Endo/Heme/Allergies: Negative.   Psychiatric/Behavioral: Negative.      Physical Exam  There were no vitals taken for this visit. Constitutional: She is oriented to person, place, and time. She appears well-developed and well-nourished.  HENT:  Head: Normocephalic and atraumatic.  Nose: Nose normal.  Mouth/Throat: Oropharynx is clear and moist. No oropharyngeal exudate.  Eyes: Conjunctivae normal and EOM are normal. Pupils are equal, round, and reactive to light. No scleral  icterus.  Neck: Normal range of motion. Neck supple. No tracheal deviation present. No thyromegaly present.  Cardiovascular: Normal rate.   Respiratory: Effort normal and breath sounds normal.  GI: Soft. Bowel sounds are normal. She exhibits no distension and no mass. There is no tenderness.  Lymphadenopathy:    She has no cervical adenopathy.  Neurological: She is alert and oriented to person, place, and time. She has normal reflexes.  Skin: Skin is warm.  Psychiatric: She has a normal mood and affect. Her behavior is normal. Judgment and thought content normal.       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 Preoperative for Gel-port assisted myomectomy and possible hysteroscopy Benefits and risks of Gel -port assited myomectomy and possible hysteroscopy were discussed with the patient and her family member again.  Bowel prep instructions were given.  All of patient's questions were answered.  She verbalized understanding.  She knows that she will need a cesarean delivery for future pregnancies, and that it is recommended she does not conceive for 2-3 months for uterus to heal.

## 2016-12-22 ENCOUNTER — Other Ambulatory Visit: Payer: Self-pay | Admitting: Obstetrics and Gynecology

## 2016-12-22 DIAGNOSIS — D250 Submucous leiomyoma of uterus: Secondary | ICD-10-CM

## 2016-12-22 DIAGNOSIS — D251 Intramural leiomyoma of uterus: Secondary | ICD-10-CM

## 2016-12-23 NOTE — Patient Instructions (Addendum)
Kelsey Reynolds  12/23/2016      Your procedure is scheduled on 12-30-16 at 9:00 AM  Report to [facility].M.  Call this number if you have problems the morning of surgery:[phone]             OUR ADDRESS IS [address] , WE ARE LOCATED IN [place].    Remember:  Do not eat food or drink liquids after midnight.  Take these medicines the morning of surgery with A SIP OF WATER: None  Do not wear jewelry, make-up or nail polish.  Do not wear lotions, powders, or perfumes, or deoderant.  Do not shave 48 hours prior to surgery.  Do not bring valuables to the hospital.  [facility] is not responsible for any belongings or valuables.  Contacts, dentures or bridgework may not be worn into surgery.    For patients admitted to the hospital, discharge time will be determined by your treatment team.   Special instructions:   Please read over the following fact sheets that you were given.    [place] - Preparing for Surgery Before surgery, you can play an important role.  Because skin is not sterile, your skin needs to be as free of germs as possible.  You can reduce the number of germs on your skin by washing with CHG (chlorahexidine gluconate) soap before surgery.  CHG is an antiseptic cleaner which kills germs and bonds with the skin to continue killing germs even after washing. Please DO NOT use if you have an allergy to CHG or antibacterial soaps.  If your skin becomes reddened/irritated stop using the CHG and inform your nurse when you arrive at Short Stay. Do not shave (including legs and underarms) for at least 48 hours prior to the first CHG shower.  You may shave your face/neck. Please follow these instructions carefully:  1.  Shower with CHG Soap the night before surgery and the  morning of Surgery.  2.  If you choose to wash your hair, wash your hair first as usual with your  normal  shampoo.  3.  After  you shampoo, rinse your hair and body thoroughly to remove the  shampoo.                           4.  Use CHG as you would any other liquid soap.  You can apply chg directly  to the skin and wash                       Gently with a scrungie or clean washcloth.  5.  Apply the CHG Soap to your body ONLY FROM THE NECK DOWN.   Do not use on face/ open                           Wound or open sores. Avoid contact with eyes, ears mouth and genitals (private parts).                       Wash face,  Genitals (private parts) with your normal soap.             6.  Wash thoroughly, paying special attention to the area where your surgery  will be performed.  7.  Thoroughly rinse your body with warm water from the neck down.  8.  DO NOT shower/wash with your normal soap after using and rinsing off  the CHG Soap.                9.  Pat yourself dry with a clean towel.            10.  Wear clean pajamas.            11.  Place clean sheets on your bed the night of your first shower and do not  sleep with pets. Day of Surgery : Do not apply any lotions/deodorants the morning of surgery.  Please wear clean clothes to the hospital/surgery center.  FAILURE TO FOLLOW THESE INSTRUCTIONS MAY RESULT IN THE CANCELLATION OF YOUR SURGERY PATIENT SIGNATURE_________________________________  NURSE SIGNATURE__________________________________  ________________________________________________________________________

## 2016-12-24 ENCOUNTER — Encounter (INDEPENDENT_AMBULATORY_CARE_PROVIDER_SITE_OTHER): Payer: Self-pay

## 2016-12-24 ENCOUNTER — Encounter (HOSPITAL_COMMUNITY): Payer: Self-pay

## 2016-12-24 ENCOUNTER — Encounter (HOSPITAL_COMMUNITY)
Admission: RE | Admit: 2016-12-24 | Discharge: 2016-12-24 | Disposition: A | Discharge status: Home / Self Care | Payer: BLUE CROSS/BLUE SHIELD | Source: Ambulatory Visit | Attending: Obstetrics and Gynecology | Admitting: Obstetrics and Gynecology

## 2016-12-24 DIAGNOSIS — Z01812 Encounter for preprocedural laboratory examination: Secondary | ICD-10-CM | POA: Insufficient documentation

## 2016-12-24 DIAGNOSIS — D251 Intramural leiomyoma of uterus: Secondary | ICD-10-CM | POA: Diagnosis not present

## 2016-12-24 HISTORY — DX: Atrophy of thyroid (acquired): E03.4

## 2016-12-24 HISTORY — DX: Vitamin D deficiency, unspecified: E55.9

## 2016-12-24 LAB — PREGNANCY, URINE: PREG TEST UR: NEGATIVE

## 2016-12-24 NOTE — Progress Notes (Signed)
12-10-16 (EPIC) CBC, CMP, LIPID, SH, VIT D results from [facility] on chart.

## 2016-12-25 NOTE — Progress Notes (Signed)
Received call from [facility] [place] [facility]. Consent not signed due to pt request for added procedure.  Nikki to re-enter order for consent per Dr. Kerin Perna.

## 2016-12-29 NOTE — H&P (Signed)
Kelsey Reynolds is a 36 y.o. female , originally referred to me by Dr. Paula Compton , for Gel-port assisted myomectomy and possible hysteroscopy.  She was diagnosed with fibroids because of abnormal uterine bleeding. She had an MRI showing 4 large fibroids.  She developed breakthrough bleeding and had to stop the pills.  She has been having monthly periods but with heavy flow and prolonged duration.  Patient would like to preserve her childbearing potential.  Pertinent Gynecological History: Menses: flow is excessive with use of 3 pads or tampons on heaviest days Bleeding: dysfunctional uterine bleeding Contraception: none DES exposure: denies Blood transfusions: none Sexually transmitted diseases: no past history Previous GYN Procedures: LEEP  Last mammogram: normal Last pap: normal  OB History: G1P001   Menstrual History: Menarche age: 56 No LMP recorded.    Past Medical History:  Diagnosis Date  . Anemia    with last miscarriage  . Fibroid    12 cm; with other small ones  . Thyroid atrophy    Nodule  . Vaginal Pap smear, abnormal    36 years old  . Vitamin D deficiency                     Past Surgical History:  Procedure Laterality Date  . DILATION AND EVACUATION N/A 10/26/2016   Procedure: DILATATION AND EVACUATION;  Surgeon: Cheri Fowler, MD;  Location: [facility];  Service: Gynecology;  Laterality: N/A;             No family history on file. No hereditary disease.  No cancer of breast, ovary, uterus. No cutaneous leiomyomatosis or renal cell carcinoma.  Social History   Social History  . Marital status: Married    Spouse name: N/A  . Number of children: N/A  . Years of education: N/A   Occupational History  . Not on file.   Social History Main Topics  . Smoking status: Never Smoker  . Smokeless tobacco: Never Used  . Alcohol use Yes     Comment: Occ  . Drug use: No  . Sexual activity: Not on file   Other Topics Concern  . Not on file    Social History Narrative  . No narrative on file    Allergies  Allergen Reactions  . Penicillins Hives    chilidhood reaction Has patient had a PCN reaction causing immediate rash, facial/tongue/throat swelling, SOB or lightheadedness with hypotension: Yes Has patient had a PCN reaction causing severe rash involving mucus membranes or skin necrosis: No Has patient had a PCN reaction that required hospitalization: No Has patient had a PCN reaction occurring within the last 10 years: No If all of the above answers are "NO", then may proceed with Cephalosporin use.  . Sulfa Antibiotics Hives and Rash    Childhood reaction    No current facility-administered medications on file prior to encounter.    Current Outpatient Prescriptions on File Prior to Encounter  Medication Sig Dispense Refill  . Cholecalciferol (VITAMIN D3) 50000 units CAPS Take 1 capsule by mouth once a week.  1  . clindamycin (CLEOCIN) 300 MG capsule Take 1 capsule (300 mg total) by mouth 3 (three) times daily. (Patient not taking: Reported on 12/24/2016) 18 capsule 0  . doxycycline (VIBRA-TABS) 100 MG tablet Take 1 tablet (100 mg total) by mouth 2 (two) times daily. (Patient not taking: Reported on 12/24/2016) 12 tablet 0  . methylergonovine (METHERGINE) 0.2 MG tablet Take 1 tablet (0.2 mg total) by mouth  3 (three) times daily. (Patient not taking: Reported on 12/24/2016) 6 tablet 0     Review of Systems  Constitutional: Negative.   HENT: Negative.   Eyes: Negative.   Respiratory: Negative.   Cardiovascular: Negative.   Gastrointestinal: Negative.   Genitourinary: Negative.   Musculoskeletal: Negative.   Skin: Negative.   Neurological: Negative.   Endo/Heme/Allergies: Negative.   Psychiatric/Behavioral: Negative.      Physical Exam  LMP 12/15/2016 (Exact Date)  Constitutional: She is oriented to person, place, and time. She appears well-developed and well-nourished.  HENT:  Head: Normocephalic and  atraumatic.  Nose: Nose normal.  Mouth/Throat: Oropharynx is clear and moist. No oropharyngeal exudate.  Eyes: Conjunctivae normal and EOM are normal. Pupils are equal, round, and reactive to light. No scleral icterus.  Neck: Normal range of motion. Neck supple. No tracheal deviation present. No thyromegaly present.  Cardiovascular: Normal rate.   Respiratory: Effort normal and breath sounds normal.  GI: Soft. Bowel sounds are normal. She exhibits no distension and no mass. There is no tenderness.  Lymphadenopathy:    She has no cervical adenopathy.  Neurological: She is alert and oriented to person, place, and time. She has normal reflexes.  Skin: Skin is warm.  Psychiatric: She has a normal mood and affect. Her behavior is normal. Judgment and thought content normal.       Assessment/Plan:  Intramural 23x41x68mm and subserosal 22x21x41mm uterine myomas, causing menorrhagia and pressure sensation. Preoperative for Gel-port assisted myomectomy and possible hysteroscopy Benefits and risks of Gel -port assited myomectomy and possible hysteroscopy were discussed with the patient and her family member again.  Bowel prep instructions were given.  All of patient's questions were answered.  She verbalized understanding.  She knows that she will need a cesarean delivery for future pregnancies, and that it is recommended she does not conceive for 2-3 months for uterus to heal.

## 2016-12-30 ENCOUNTER — Ambulatory Visit (HOSPITAL_BASED_OUTPATIENT_CLINIC_OR_DEPARTMENT_OTHER): Payer: BLUE CROSS/BLUE SHIELD | Admitting: Anesthesiology

## 2016-12-30 ENCOUNTER — Encounter (HOSPITAL_BASED_OUTPATIENT_CLINIC_OR_DEPARTMENT_OTHER)
Admission: RE | Disposition: A | Discharge status: Home / Self Care | Payer: Self-pay | Source: Ambulatory Visit | Attending: Obstetrics and Gynecology

## 2016-12-30 ENCOUNTER — Encounter (HOSPITAL_BASED_OUTPATIENT_CLINIC_OR_DEPARTMENT_OTHER): Payer: Self-pay | Admitting: *Deleted

## 2016-12-30 ENCOUNTER — Ambulatory Visit (HOSPITAL_BASED_OUTPATIENT_CLINIC_OR_DEPARTMENT_OTHER)
Admission: RE | Admit: 2016-12-30 | Discharge: 2016-12-30 | Disposition: A | Discharge status: Home / Self Care | Payer: BLUE CROSS/BLUE SHIELD | Source: Ambulatory Visit | Attending: Obstetrics and Gynecology | Admitting: Obstetrics and Gynecology

## 2016-12-30 DIAGNOSIS — D251 Intramural leiomyoma of uterus: Secondary | ICD-10-CM | POA: Diagnosis not present

## 2016-12-30 DIAGNOSIS — D259 Leiomyoma of uterus, unspecified: Secondary | ICD-10-CM | POA: Diagnosis present

## 2016-12-30 DIAGNOSIS — N808 Other endometriosis: Secondary | ICD-10-CM | POA: Insufficient documentation

## 2016-12-30 HISTORY — PX: LAPAROSCOPIC GELPORT ASSISTED MYOMECTOMY: SHX6549

## 2016-12-30 HISTORY — PX: HYSTEROSCOPY: SHX211

## 2016-12-30 LAB — POCT PREGNANCY, URINE: PREG TEST UR: NEGATIVE

## 2016-12-30 LAB — ABO/RH: ABO/RH(D): A NEG

## 2016-12-30 LAB — TYPE AND SCREEN
ABO/RH(D): A NEG
ANTIBODY SCREEN: NEGATIVE

## 2016-12-30 SURGERY — LAPAROSCOPIC GELPORT ASSISTED MYOMECTOMY
Anesthesia: General | Site: Vagina

## 2016-12-30 MED ORDER — VASOPRESSIN 20 UNIT/ML IV SOLN
INTRAVENOUS | Status: DC | PRN
  Administered 2016-12-30: 10 mL via INTRAMUSCULAR
  Administered 2016-12-30: 11:00:00 via INTRAMUSCULAR

## 2016-12-30 MED ORDER — MIDAZOLAM HCL 2 MG/2ML IJ SOLN
INTRAMUSCULAR | Status: AC
  Filled 2016-12-30: qty 2

## 2016-12-30 MED ORDER — DEXAMETHASONE SODIUM PHOSPHATE 10 MG/ML IJ SOLN
INTRAMUSCULAR | Status: AC
  Filled 2016-12-30: qty 1

## 2016-12-30 MED ORDER — ONDANSETRON HCL 4 MG PO TABS: 4.0000 mg | ORAL_TABLET | Freq: Three times a day (TID) | ORAL | 0 refills | Status: DC | PRN

## 2016-12-30 MED ORDER — FENTANYL CITRATE (PF) 100 MCG/2ML IJ SOLN
INTRAMUSCULAR | Status: AC
  Filled 2016-12-30: qty 2

## 2016-12-30 MED ORDER — ROCURONIUM BROMIDE 10 MG/ML (PF) SYRINGE
PREFILLED_SYRINGE | INTRAVENOUS | Status: DC | PRN
Start: 2016-12-30 — End: 2016-12-30
  Administered 2016-12-30 (×3): 10 mg via INTRAVENOUS
  Administered 2016-12-30: 50 mg via INTRAVENOUS
  Administered 2016-12-30 (×2): 10 mg via INTRAVENOUS

## 2016-12-30 MED ORDER — KETOROLAC TROMETHAMINE 30 MG/ML IJ SOLN
INTRAMUSCULAR | Status: AC
  Filled 2016-12-30: qty 1

## 2016-12-30 MED ORDER — PROMETHAZINE HCL 25 MG/ML IJ SOLN
6.2500 mg | INTRAMUSCULAR | Status: DC | PRN
  Filled 2016-12-30: qty 1

## 2016-12-30 MED ORDER — SUGAMMADEX SODIUM 200 MG/2ML IV SOLN
INTRAVENOUS | Status: AC
  Filled 2016-12-30: qty 2

## 2016-12-30 MED ORDER — HYDROMORPHONE HCL 1 MG/ML IJ SOLN
0.2500 mg | INTRAMUSCULAR | Status: DC | PRN
  Administered 2016-12-30: 0.5 mg via INTRAVENOUS
  Filled 2016-12-30: qty 0.5

## 2016-12-30 MED ORDER — LIDOCAINE 2% (20 MG/ML) 5 ML SYRINGE
INTRAMUSCULAR | Status: DC | PRN
  Administered 2016-12-30: 100 mg via INTRAVENOUS

## 2016-12-30 MED ORDER — PROPOFOL 10 MG/ML IV BOLUS
INTRAVENOUS | Status: AC
  Filled 2016-12-30: qty 20

## 2016-12-30 MED ORDER — OXYCODONE HCL 5 MG PO TABS
5.0000 mg | ORAL_TABLET | Freq: Once | ORAL | Status: AC
  Administered 2016-12-30: 5 mg via ORAL
  Filled 2016-12-30: qty 1

## 2016-12-30 MED ORDER — LACTATED RINGERS IV SOLN
INTRAVENOUS | Status: DC
  Administered 2016-12-30 (×3): via INTRAVENOUS
  Filled 2016-12-30: qty 1000

## 2016-12-30 MED ORDER — ROCURONIUM BROMIDE 50 MG/5ML IV SOSY
PREFILLED_SYRINGE | INTRAVENOUS | Status: AC
  Filled 2016-12-30: qty 10

## 2016-12-30 MED ORDER — KETAMINE HCL 10 MG/ML IJ SOLN
INTRAMUSCULAR | Status: AC
  Filled 2016-12-30: qty 1

## 2016-12-30 MED ORDER — METHYLENE BLUE 0.5 % INJ SOLN
INTRAVENOUS | Status: DC | PRN
  Administered 2016-12-30: 3 mL

## 2016-12-30 MED ORDER — SCOPOLAMINE 1 MG/3DAYS TD PT72
1.0000 | MEDICATED_PATCH | TRANSDERMAL | Status: DC
  Filled 2016-12-30: qty 1

## 2016-12-30 MED ORDER — OXYCODONE-ACETAMINOPHEN 7.5-325 MG PO TABS: 1.0000 | ORAL_TABLET | ORAL | 0 refills | Status: DC | PRN

## 2016-12-30 MED ORDER — ONDANSETRON HCL 4 MG/2ML IJ SOLN
INTRAMUSCULAR | Status: DC | PRN
  Administered 2016-12-30: 4 mg via INTRAVENOUS

## 2016-12-30 MED ORDER — PROPOFOL 10 MG/ML IV BOLUS
INTRAVENOUS | Status: DC | PRN
  Administered 2016-12-30: 170 mg via INTRAVENOUS

## 2016-12-30 MED ORDER — GENTAMICIN SULFATE 40 MG/ML IJ SOLN
INTRAMUSCULAR | Status: AC
  Administered 2016-12-30: 310 mL via INTRAVENOUS
  Filled 2016-12-30: qty 7.75

## 2016-12-30 MED ORDER — PROPOFOL 500 MG/50ML IV EMUL
INTRAVENOUS | Status: AC
  Filled 2016-12-30: qty 100

## 2016-12-30 MED ORDER — OXYCODONE HCL 5 MG PO TABS
ORAL_TABLET | ORAL | Status: AC
  Filled 2016-12-30: qty 1

## 2016-12-30 MED ORDER — SUGAMMADEX SODIUM 200 MG/2ML IV SOLN
INTRAVENOUS | Status: DC | PRN
  Administered 2016-12-30: 200 mg via INTRAVENOUS

## 2016-12-30 MED ORDER — SCOPOLAMINE 1 MG/3DAYS TD PT72
1.0000 | MEDICATED_PATCH | TRANSDERMAL | Status: DC
  Administered 2016-12-30: 1.5 mg via TRANSDERMAL
  Filled 2016-12-30: qty 1

## 2016-12-30 MED ORDER — PROPOFOL 500 MG/50ML IV EMUL
INTRAVENOUS | Status: AC
  Filled 2016-12-30: qty 50

## 2016-12-30 MED ORDER — PROPOFOL 500 MG/50ML IV EMUL
INTRAVENOUS | Status: DC | PRN
  Administered 2016-12-30: 200 ug/kg/min via INTRAVENOUS

## 2016-12-30 MED ORDER — MIDAZOLAM HCL 2 MG/2ML IJ SOLN
INTRAMUSCULAR | Status: DC | PRN
  Administered 2016-12-30: 2 mg via INTRAVENOUS

## 2016-12-30 MED ORDER — HYDROMORPHONE HCL-NACL 0.5-0.9 MG/ML-% IV SOSY
PREFILLED_SYRINGE | INTRAVENOUS | Status: AC
  Filled 2016-12-30: qty 1

## 2016-12-30 MED ORDER — DEXAMETHASONE SODIUM PHOSPHATE 10 MG/ML IJ SOLN
INTRAMUSCULAR | Status: DC | PRN
  Administered 2016-12-30: 10 mg via INTRAVENOUS

## 2016-12-30 MED ORDER — BUPIVACAINE-EPINEPHRINE 0.5% -1:200000 IJ SOLN
INTRAMUSCULAR | Status: DC | PRN
  Administered 2016-12-30: 10 mL

## 2016-12-30 MED ORDER — ONDANSETRON HCL 4 MG/2ML IJ SOLN
INTRAMUSCULAR | Status: AC
  Filled 2016-12-30: qty 2

## 2016-12-30 MED ORDER — SODIUM CHLORIDE 0.9 % IR SOLN
Status: DC | PRN
  Administered 2016-12-30: 2000 mL

## 2016-12-30 MED ORDER — WHITE PETROLATUM GEL
Status: AC
  Filled 2016-12-30: qty 5

## 2016-12-30 MED ORDER — LIDOCAINE 2% (20 MG/ML) 5 ML SYRINGE
INTRAMUSCULAR | Status: AC
  Filled 2016-12-30: qty 5

## 2016-12-30 MED ORDER — KETOROLAC TROMETHAMINE 30 MG/ML IJ SOLN
INTRAMUSCULAR | Status: DC | PRN
  Administered 2016-12-30: 30 mg via INTRAVENOUS

## 2016-12-30 MED ORDER — SCOPOLAMINE 1 MG/3DAYS TD PT72
MEDICATED_PATCH | TRANSDERMAL | Status: AC
Start: 2016-12-30 — End: 2016-12-30
  Filled 2016-12-30: qty 1

## 2016-12-30 MED ORDER — FENTANYL CITRATE (PF) 100 MCG/2ML IJ SOLN
INTRAMUSCULAR | Status: DC | PRN
  Administered 2016-12-30: 50 ug via INTRAVENOUS
  Administered 2016-12-30: 25 ug via INTRAVENOUS
  Administered 2016-12-30: 50 ug via INTRAVENOUS
  Administered 2016-12-30 (×3): 25 ug via INTRAVENOUS

## 2016-12-30 MED ORDER — SODIUM CHLORIDE 0.9 % IV SOLN
INTRAVENOUS | Status: DC | PRN
  Administered 2016-12-30: 10 ug/kg/min via INTRAVENOUS

## 2016-12-30 SURGICAL SUPPLY — 75 items
CABLE HIGH FREQUENCY MONO STRZ (ELECTRODE) ×3 IMPLANT
CATH ROBINSON RED A/P 16FR (CATHETERS) IMPLANT
CLEANER CAUTERY TIP 5X5 PAD (MISCELLANEOUS) IMPLANT
CLOTH BEACON ORANGE TIMEOUT ST (SAFETY) ×3 IMPLANT
CONT SPEC 4OZ CLIKSEAL STRL BL (MISCELLANEOUS) ×3 IMPLANT
COVER MAYO STAND STRL (DRAPES) ×3 IMPLANT
DERMABOND ADVANCED (GAUZE/BANDAGES/DRESSINGS) ×1
DERMABOND ADVANCED .7 DNX12 (GAUZE/BANDAGES/DRESSINGS) ×2 IMPLANT
DEVICE TROCAR PUNCTURE CLOSURE (ENDOMECHANICALS) IMPLANT
DRSG OPSITE POSTOP 3X4 (GAUZE/BANDAGES/DRESSINGS) ×12 IMPLANT
DRSG OPSITE POSTOP 4X10 (GAUZE/BANDAGES/DRESSINGS) ×3 IMPLANT
DRSG TEGADERM 4X4.75 (GAUZE/BANDAGES/DRESSINGS) IMPLANT
DRSG TELFA 3X8 NADH (GAUZE/BANDAGES/DRESSINGS) IMPLANT
DURAPREP 26ML APPLICATOR (WOUND CARE) ×3 IMPLANT
ELECT BLADE 6.5 .24CM SHAFT (ELECTRODE) IMPLANT
ELECT NEEDLE TIP 2.8 STRL (NEEDLE) IMPLANT
ELECT REM PT RETURN 9FT ADLT (ELECTROSURGICAL) ×3
ELECTRODE REM PT RTRN 9FT ADLT (ELECTROSURGICAL) ×2 IMPLANT
FILTER SMOKE EVAC LAPAROSHD (FILTER) IMPLANT
GAUZE SPONGE 4X4 16PLY XRAY LF (GAUZE/BANDAGES/DRESSINGS) ×3 IMPLANT
GLOVE BIO SURGEON STRL SZ 6.5 (GLOVE) ×3 IMPLANT
GLOVE BIO SURGEON STRL SZ8 (GLOVE) ×3 IMPLANT
GLOVE BIOGEL PI IND STRL 6.5 (GLOVE) ×2 IMPLANT
GLOVE BIOGEL PI IND STRL 7.5 (GLOVE) ×6 IMPLANT
GLOVE BIOGEL PI IND STRL 8.5 (GLOVE) ×2 IMPLANT
GLOVE BIOGEL PI INDICATOR 6.5 (GLOVE) ×1
GLOVE BIOGEL PI INDICATOR 7.5 (GLOVE) ×3
GLOVE BIOGEL PI INDICATOR 8.5 (GLOVE) ×1
GOWN STRL REUS W/ TWL LRG LVL3 (GOWN DISPOSABLE) IMPLANT
GOWN STRL REUS W/TWL LRG LVL3 (GOWN DISPOSABLE) ×6 IMPLANT
GOWN STRL REUS W/TWL XL LVL3 (GOWN DISPOSABLE) ×6 IMPLANT
HOLDER FOLEY CATH W/STRAP (MISCELLANEOUS) IMPLANT
KIT RM TURNOVER CYSTO AR (KITS) ×3 IMPLANT
MANIPULATOR UTERINE 4.5 ZUMI (MISCELLANEOUS) ×3 IMPLANT
NDL SAFETY ECLIPSE 18X1.5 (NEEDLE) ×2 IMPLANT
NEEDLE HYPO 18GX1.5 SHARP (NEEDLE) ×1
NEEDLE HYPO 25X1 1.5 SAFETY (NEEDLE) ×3 IMPLANT
NEEDLE INSUFFLATION 14GA 120MM (NEEDLE) ×3 IMPLANT
NS IRRIG 500ML POUR BTL (IV SOLUTION) ×3 IMPLANT
PACK LAPAROSCOPY BASIN (CUSTOM PROCEDURE TRAY) ×3 IMPLANT
PACK TRENDGUARD 450 HYBRID PRO (MISCELLANEOUS) IMPLANT
PAD CLEANER CAUTERY TIP 5X5 (MISCELLANEOUS)
PAD OB MATERNITY 4.3X12.25 (PERSONAL CARE ITEMS) ×3 IMPLANT
PENCIL BUTTON HOLSTER BLD 10FT (ELECTRODE) IMPLANT
POUCH SPECIMEN RETRIEVAL 10MM (ENDOMECHANICALS) IMPLANT
SEPRAFILM MEMBRANE 5X6 (MISCELLANEOUS) ×6 IMPLANT
SET IRRIG TUBING LAPAROSCOPIC (IRRIGATION / IRRIGATOR) ×3 IMPLANT
SHEARS HARMONIC ACE PLUS 45CM (MISCELLANEOUS) IMPLANT
SPONGE LAP 18X18 X RAY DECT (DISPOSABLE) ×3 IMPLANT
SUT MNCRL AB 4-0 PS2 18 (SUTURE) ×3 IMPLANT
SUT PROLENE 0 CT 1 30 (SUTURE) IMPLANT
SUT VIC AB 0 CT1 36 (SUTURE) IMPLANT
SUT VIC AB 2-0 CT1 27 (SUTURE) ×4
SUT VIC AB 2-0 CT1 TAPERPNT 27 (SUTURE) ×8 IMPLANT
SUT VIC AB 2-0 CT2 27 (SUTURE) ×9 IMPLANT
SUT VIC AB 2-0 UR6 27 (SUTURE) ×3 IMPLANT
SUT VIC AB 4-0 SH 27 (SUTURE) ×2
SUT VIC AB 4-0 SH 27XANBCTRL (SUTURE) ×4 IMPLANT
SUT VICRYL 0 TIES 12 18 (SUTURE) IMPLANT
SYR 20CC LL (SYRINGE) IMPLANT
SYR 30ML LL (SYRINGE) IMPLANT
SYR 5ML LL (SYRINGE) ×3 IMPLANT
SYR CONTROL 10ML LL (SYRINGE) ×6 IMPLANT
SYRINGE 60CC LL (MISCELLANEOUS) ×3 IMPLANT
SYS LAPSCP GELPORT 120MM (MISCELLANEOUS) ×3
SYSTEM LAPSCP GELPORT 120MM (MISCELLANEOUS) ×2 IMPLANT
TAPE STRIPS DRAPE STRL (GAUZE/BANDAGES/DRESSINGS) ×12 IMPLANT
TOWEL OR 17X24 6PK STRL BLUE (TOWEL DISPOSABLE) ×6 IMPLANT
TRAY FOLEY CATH SILVER 14FR (SET/KITS/TRAYS/PACK) ×3 IMPLANT
TRENDGUARD 450 HYBRID PRO PACK (MISCELLANEOUS)
TROCAR OPTI TIP 5M 100M (ENDOMECHANICALS) ×3 IMPLANT
TROCAR XCEL DIL TIP R 11M (ENDOMECHANICALS) IMPLANT
TUBING INSUF HEATED (TUBING) ×3 IMPLANT
WARMER LAPAROSCOPE (MISCELLANEOUS) ×3 IMPLANT
WATER STERILE IRR 500ML POUR (IV SOLUTION) ×3 IMPLANT

## 2016-12-30 NOTE — Transfer of Care (Signed)
Immediate Anesthesia Transfer of Care Note  Patient: Kelsey Reynolds  Procedure(s) Performed: Procedure(s) (LRB): Laparascopy, gel port assisted myomectomy, chromotubation, lysis of adhesions, excision of endometriosis, (N/A) HYSTEROSCOPY (N/A)  Patient Location: PACU  Anesthesia Type: General  Level of Consciousness: awake, oriented, sedated and patient cooperative  Airway & Oxygen Therapy: Patient Spontanous Breathing and Patient connected to face mask oxygen  Post-op Assessment: Report given to PACU RN and Post -op Vital signs reviewed and stable  Post vital signs: Reviewed and stable  Complications: No apparent anesthesia complications  Last Vitals:  Vitals:   12/30/16 0722 12/30/16 1428  BP: 112/74 122/87  Pulse: 85 (!) 110  Resp: 17 (!) 22  Temp: 36.6 C (!) 36.4 C  SpO2: 99% 100%    Last Pain:  Vitals:   12/30/16 1428  TempSrc:   PainSc: Asleep      Patients Stated Pain Goal: 5 (12/30/16 0746)

## 2016-12-30 NOTE — Discharge Instructions (Signed)
°  Post Anesthesia Home Care Instructions  Activity: Get plenty of rest for the remainder of the day. A responsible individual must stay with you for 24 hours following the procedure.  For the next 24 hours, DO NOT: -Drive a car -Paediatric nurse -Drink alcoholic beverages -Take any medication unless instructed by your physician -Make any legal decisions or sign important papers.  Meals: Start with liquid foods such as gelatin or soup. Progress to regular foods as tolerated. Avoid greasy, spicy, heavy foods. If nausea and/or vomiting occur, drink only clear liquids until the nausea and/or vomiting subsides. Call your physician if vomiting continues.  Special Instructions/Symptoms: Your throat may feel dry or sore from the anesthesia or the breathing tube placed in your throat during surgery. If this causes discomfort, gargle with warm salt water. The discomfort should disappear within 24 hours.  If you had a scopolamine patch placed behind your ear for the management of post- operative nausea and/or vomiting:  1. The medication in the patch is effective for 72 hours, after which it should be removed.  Wrap patch in a tissue and discard in the trash. Wash hands thoroughly with soap and water. 2. You may remove the patch earlier than 72 hours if you experience unpleasant side effects which may include dry mouth, dizziness or visual disturbances. 3. Avoid touching the patch. Wash your hands with soap and water after contact with the patch.  May take motrin or ibuprofen after 7:30 pm if needed.   HOME CARE INSTRUCTIONS - LAPAROSCOPY  Wound Care: The bandaids or dressing which are placed over the skin openings may be removed the day after surgery. The incision should be kept clean and dry. The stitches do not need to be removed. Should the incision become sore, red, and swollen after the first week, check with your doctor.  Personal Hygiene: Shower the day after your procedure. Always wipe  from front to back after elimination.   Activity: Do not drive or operate any equipment today. The effects of the anesthesia are still present and drowsiness may result. Rest today, not necessarily flat bed rest, just take it easy. You may resume your normal activity in one to three days or as instructed by your physician.  Sexual Activity: You resume sexual activity as indicated by your physician_________.   Diet: Eat a light diet as desired this evening. You may resume a regular diet tomorrow.  Return to Work: Two to three days or as indicated by your doctor.  Expectations After Surgery: Your surgery will cause vaginal drainage or spotting which may continue for 2-3 days. Mild abdominal discomfort or tenderness is not unusual and some shoulder pain may also be noted which can be relieved by heating pad and sitting up.   Call Your Doctor If these Occur:  Persistent or heavy bleeding at incision site       Redness or swelling around incision       Elevation of temperature greater than 100 degrees F

## 2016-12-30 NOTE — Anesthesia Preprocedure Evaluation (Addendum)
Anesthesia Evaluation  Patient identified by MRN, date of birth, ID band Patient awake    Reviewed: Allergy & Precautions, NPO status , Patient's Chart, lab work & pertinent test results  Airway Mallampati: II  TM Distance: >3 FB Neck ROM: Full    Dental no notable dental hx. (+) Dental Advisory Given   Pulmonary neg pulmonary ROS,    Pulmonary exam normal        Cardiovascular negative cardio ROS Normal cardiovascular exam     Neuro/Psych negative neurological ROS  negative psych ROS   GI/Hepatic negative GI ROS, Neg liver ROS,   Endo/Other  negative endocrine ROS  Renal/GU negative Renal ROS  negative genitourinary   Musculoskeletal negative musculoskeletal ROS (+)   Abdominal   Peds negative pediatric ROS (+)  Hematology negative hematology ROS (+)   Anesthesia Other Findings   Reproductive/Obstetrics                             Anesthesia Physical Anesthesia Plan  ASA: I  Anesthesia Plan: General   Post-op Pain Management:    Induction:   PONV Risk Score and Plan: 4 or greater and Ondansetron, Dexamethasone, Scopolamine patch - Pre-op and Diphenhydramine  Airway Management Planned: Oral ETT  Additional Equipment:   Intra-op Plan:   Post-operative Plan: Extubation in OR  Informed Consent: I have reviewed the patients History and Physical, chart, labs and discussed the procedure including the risks, benefits and alternatives for the proposed anesthesia with the patient or authorized representative who has indicated his/her understanding and acceptance.   Dental advisory given  Plan Discussed with: CRNA, Anesthesiologist and Surgeon  Anesthesia Plan Comments: (TIVA )       Anesthesia Quick Evaluation

## 2016-12-30 NOTE — Op Note (Signed)
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 endotracheal  Complications: None  Estimated blood loss: 200 cc  Specimens: Uterine myoma, posterior uterine wall biopsy and peritoneal biopsies to pathology  Findings: On examination under anesthesia, external genitalia, Bartholin's, Skene's, and urethra were normal. The vagina was normal. The cervix was nulliparous and appeared grossly normal. The uterus was 16 week size, firm and mobile with irregularities caused by myomas. It sounded to 14 cm.  On laparoscopy, upper abdomen, liver surface and diaphragm surfaces were normal. Gallbladder was normal. The appendix appeared normal.  The uterus contained A 12 x 8 cm anterior left-sided transmural myoma the posterior and right aspect of which was attached to the endometrium, which was entered during the myomectomy. There was also a 4 x 4 centimeter posterior myoma with the surrounding myometrial changes suggestive of adenomyosis.  This myoma was also removed and the posterior wall of the uterus was separately biopsied and the specimen was submitted to pathology.The left tube and ovary appeared normal. The right tube appeared normal.  The pelvic peritoneum looked mostly normal but there was a posterior cul-de-sac stellate lesion suggestive of endometriosis which was excised.    Description of the procedure: The patient was placed in dorsal supine position and general endotracheal anesthesia was given. 2 g of cefazolin were given intravenously for prophylaxis. Patient was placed in lithotomy position. She was prepped and draped in  sterile manner. A Foley catheter was inserted into the bladder.. A ZUMI catheter was placed into the uterine cavity. This was connected to a syringe containing diluted methylene blue solution which was used to define the endometrium during the myomectomy. The uterus sounded to 14 cm The surgeon was regloved and a surgical field was created on the abdomen.  After preemptive anesthesia of all surgical sites with 0.5% bupivacaine, a 5 mm intraumbilical skin incision was made and a Verress needle was inserted. Its correct location was confirmed. A pneumoperitoneum was created with carbon dioxide.  5 mm laparoscope with a 30 lens was inserted and video laparoscopy was started . A right lower quadrant 5 mm Incision wasmade and ancillary trochar was placed under direct visualization. Above findings were noted.  A dilute solution of vasopressin (0.4 units per mL) was injected into the myometrium overlying the fundal myoma, until the myometrium blanched. A needle electrode with 49 W of cutting current  was used to make a vertical incision on the myometrium overlying the fundal 12 cm myoma. The myoma was grasped with tenaculum and dissection was started.  We then made a 3 cm transverse suprapubic incision to insert a GelPort. After dissection of the anatomic layers, the peritoneal cavity was entered. A GelPort was placed and the rest of the case was performed either using this port as a laparoscopic port or as a minilaparotomy port.  The rest of the myoma had to be in situ morcellated to eventually take it out of this 3 cm incision. This was carried out by blunt and sharp dissection and in situ morcellation was performed with #10 blade. The endometrial cavity was entered at the lower aspect of the 12 cm myoma. This was detected by the egress of methylene blue solution injected transcervically.  The myoma defect was closed in 4 layers:  The first layer was a deep myometrial suture of 2-0 Vicryl continuous interlocking  suture, the second and third layers were a superficial myometrial layer of 2-0 Vicryl continuous suture. A 4-0 Vicryl continuous suture was placed on the serosa and the most superficial myometrium.  Attention was then turned to the posterior myoma and this myoma was dissected free of the posterior wall of the uterus.  The surrounding myometrium was noted to be thickened and of adenomyotic texture and this portion of the myometrium was separately biopsied and sent to pathology. The myoma defect was separately closed in 3 layers, in a similar fashion to the closure of the defect of the larger myoma. The suprapubic fascial incision was closed with 2-0 Vicryl continuous suture. Subcutaneous tissue was irrigated and aspirated good hemostasis was achieved. The abdomen and the pelvis was carefully inspected under laparoscopic visualization and the pelvis was copiously irrigated and aspirated. A slurry of 2 sheets of Seprafilm in 60 mL of normal saline was injected as an adhesion barrier into the pelvis. The gas was allowed to escape. The instrument and the lap pad count were correct. The trochars were removed. The skin incisions were approximated with 4-0 Monocryl in subcuticular sutures, including the 3 cm skin incision that belonged to the Bramwell site. The surgeon then was positioned between the patient's legs to perform a hysteroscopy.  A vaginal speculum was inserted and the cervix was grasped with a tenaculum.  A SlimLine hysteroscope with a 12 lens was inserted into the cervical canal and secured hysteroscopy was started.  Hysteroscopic distention medium was saline and the distention method was gravity. And normal uterine cavity configuration was confirmed.  The endometrium was disrupted due to the use of the ZUMI catheter.( endometrial stitches were visualized.  Both tubal ostial regions were visualized.  The procedure was then terminated.  The patient tolerated the procedure well and was transferred to  recovery room in satisfactory condition.  SPECIAL NOTE: Because of the extent of the myometrial incision during the uterine myomectomy, it is recommended that this patient deliver by a cesarean section with her future pregnancies.  Governor Specking, MD

## 2016-12-30 NOTE — Anesthesia Postprocedure Evaluation (Signed)
Anesthesia Post Note  Patient: Rianne Degraaf McLean-Scocuzza  Procedure(s) Performed: Procedure(s) (LRB): Laparascopy, gel port assisted myomectomy, chromotubation, lysis of adhesions, excision of endometriosis, (N/A) HYSTEROSCOPY (N/A)     Patient location during evaluation: PACU Anesthesia Type: General Level of consciousness: sedated Pain management: pain level controlled Vital Signs Assessment: post-procedure vital signs reviewed and stable Respiratory status: spontaneous breathing and respiratory function stable Cardiovascular status: stable Postop Assessment: no apparent nausea or vomiting Anesthetic complications: no    Last Vitals:  Vitals:   12/30/16 1545 12/30/16 1636  BP: 130/83 125/79  Pulse: 74 71  Resp: 10 16  Temp:  36.4 C  SpO2: 100% 100%    Last Pain:  Vitals:   12/30/16 1615  TempSrc:   PainSc: 4                  Dorrian Doggett DANIEL

## 2016-12-30 NOTE — Anesthesia Procedure Notes (Signed)
Procedure Name: Intubation Date/Time: 12/30/2016 9:59 AM Performed by: Denna Haggard D Pre-anesthesia Checklist: Patient identified, Emergency Drugs available, Suction available and Patient being monitored Patient Re-evaluated:Patient Re-evaluated prior to induction Oxygen Delivery Method: Circle system utilized Preoxygenation: Pre-oxygenation with 100% oxygen Induction Type: IV induction Ventilation: Mask ventilation without difficulty Laryngoscope Size: Mac and 3 Grade View: Grade I Tube type: Oral Tube size: 7.0 mm Number of attempts: 1 Airway Equipment and Method: Stylet and Oral airway Placement Confirmation: ETT inserted through vocal cords under direct vision,  positive ETCO2 and breath sounds checked- equal and bilateral Secured at: 22 cm Tube secured with: Tape Dental Injury: Teeth and Oropharynx as per pre-operative assessment

## 2016-12-31 ENCOUNTER — Encounter (HOSPITAL_BASED_OUTPATIENT_CLINIC_OR_DEPARTMENT_OTHER): Payer: Self-pay | Admitting: Obstetrics and Gynecology

## 2017-01-11 ENCOUNTER — Emergency Department (HOSPITAL_COMMUNITY): Payer: BLUE CROSS/BLUE SHIELD

## 2017-01-11 ENCOUNTER — Encounter (HOSPITAL_COMMUNITY): Payer: Self-pay | Admitting: Emergency Medicine

## 2017-01-11 ENCOUNTER — Emergency Department (HOSPITAL_COMMUNITY)
Admission: EM | Admit: 2017-01-11 | Discharge: 2017-01-11 | Disposition: A | Discharge status: Home / Self Care | Payer: BLUE CROSS/BLUE SHIELD | Attending: Emergency Medicine | Admitting: Emergency Medicine

## 2017-01-11 DIAGNOSIS — K5900 Constipation, unspecified: Secondary | ICD-10-CM | POA: Insufficient documentation

## 2017-01-11 DIAGNOSIS — Z79899 Other long term (current) drug therapy: Secondary | ICD-10-CM | POA: Insufficient documentation

## 2017-01-11 DIAGNOSIS — R109 Unspecified abdominal pain: Secondary | ICD-10-CM | POA: Diagnosis not present

## 2017-01-11 DIAGNOSIS — K5641 Fecal impaction: Secondary | ICD-10-CM

## 2017-01-11 MED ORDER — FLEET ENEMA 7-19 GM/118ML RE ENEM
1.0000 | ENEMA | Freq: Once | RECTAL | Status: DC
Start: 2017-01-11 — End: 2017-01-11

## 2017-01-11 NOTE — ED Provider Notes (Signed)
[facility] DEPT Provider Note   CSN: [id number] Arrival date & time: 01/11/17  0443     History   Chief Complaint Chief Complaint  Patient presents with  . Constipation  . Post-op Problem    HPI Kelsey Fetting Reynolds is a 36 y.o. female.  Patient is a 36 year old female who presents with complaints of constipation. She reports not having a significant bowel movement for the past week. She is 2 weeks status post myomectomy for uterine fibroids. She reports she is passing gas, however feels fecal impacted. She was able to manually disimpact herself somewhat at home, however still feels bloated and uncomfortable. She denies any vomiting, fevers or chills. She has tried taking magnesium citrate and senna at home.   The history is provided by the patient.  Constipation   This is a new problem. Associated symptoms include abdominal pain. She has tried nothing for the symptoms. The treatment provided no relief.    Past Medical History:  Diagnosis Date  . Anemia    with last miscarriage  . Fibroid    12 cm; with other small ones  . Thyroid atrophy    Nodule  . Vaginal Pap smear, abnormal    36 years old  . Vitamin D deficiency     Patient Active Problem List   Diagnosis Date Noted  . Endometritis following abortive pregnancy 10/24/2016  . UTI (urinary tract infection) 10/07/2012    Past Surgical History:  Procedure Laterality Date  . DILATION AND EVACUATION N/A 10/26/2016   Procedure: DILATATION AND EVACUATION;  Surgeon: Cheri Fowler, MD;  Location: [facility];  Service: Gynecology;  Laterality: N/A;  . HYSTEROSCOPY N/A 12/30/2016   Procedure: HYSTEROSCOPY;  Surgeon: Governor Specking, MD;  Location: [facility];  Service: Gynecology;  Laterality: N/A;  . LAPAROSCOPIC GELPORT ASSISTED MYOMECTOMY N/A 12/30/2016   Procedure: Laparascopy, gel port assisted myomectomy, chromotubation, lysis of adhesions, excision of endometriosis,;  Surgeon: Governor Specking, MD;  Location: [facility];  Service: Gynecology;  Laterality: N/A;    OB History    Gravida Para Term Preterm AB Living   2 0 0 0 2 0   SAB TAB Ectopic Multiple Live Births   2               Home Medications    Prior to Admission medications   Medication Sig Start Date End Date Taking? Authorizing Provider  Cholecalciferol (VITAMIN D3) 50000 units CAPS Take 1 capsule by mouth once a week. 07/22/16   [provider]  clindamycin (CLEOCIN) 300 MG capsule Take 1 capsule (300 mg total) by mouth 3 (three) times daily. Patient not taking: Reported on 12/24/2016 10/26/16   Meisinger, Sherren Mocha, MD  doxycycline (VIBRA-TABS) 100 MG tablet Take 1 tablet (100 mg total) by mouth 2 (two) times daily. Patient not taking: Reported on 12/24/2016 10/26/16   Meisinger, Sherren Mocha, MD  influenza vac recom quadrivalent (FLUBLOK) 0.5 ML injection Inject 0.5 mLs into the muscle once.    [provider]  methylergonovine (METHERGINE) 0.2 MG tablet Take 1 tablet (0.2 mg total) by mouth 3 (three) times daily. Patient not taking: Reported on 12/24/2016 10/26/16   Meisinger, Sherren Mocha, MD  ondansetron (ZOFRAN) 4 MG tablet Take 1 tablet (4 mg total) by mouth every 8 (eight) hours as needed for nausea or vomiting. 12/30/16   Governor Specking, MD  oxyCODONE-acetaminophen (PERCOCET) 7.5-325 MG tablet Take 1 tablet by mouth every 4 (four) hours as needed. 12/30/16   Governor Specking,  MD    Family History History reviewed. No pertinent family history.  Social History Social History  Substance Use Topics  . Smoking status: Never Smoker  . Smokeless tobacco: Never Used  . Alcohol use Yes     Comment: Occ     Allergies   Penicillins and Sulfa antibiotics   Review of Systems Review of Systems  Gastrointestinal: Positive for abdominal pain and constipation.  All other systems reviewed and are negative.    Physical Exam Updated Vital Signs BP 121/80 (BP Location: Left Arm)   Pulse  92   Temp 98.2 F (36.8 C) (Oral)   Resp 16   Ht 5\' 10"  (1.778 m)   Wt 61.2 kg (135 lb)   LMP 12/15/2016 (Exact Date)   SpO2 100%   BMI 19.37 kg/m   Physical Exam  Constitutional: She is oriented to person, place, and time. She appears well-developed and well-nourished. No distress.  HENT:  Head: Normocephalic and atraumatic.  Neck: Normal range of motion. Neck supple.  Cardiovascular: Normal rate and regular rhythm.  Exam reveals no gallop and no friction rub.   No murmur heard. Pulmonary/Chest: Effort normal and breath sounds normal. No respiratory distress. She has no wheezes.  Abdominal: Soft. Bowel sounds are normal. She exhibits no distension. There is tenderness. There is no rebound and no guarding.  The surgical incisions appear clean, dry, and intact. The Steri-Strips are in place with no significant drainage. The abdominal exam is essentially benign. She does have mild generalized tenderness, however no focal tenderness or peritoneal signs.  Musculoskeletal: Normal range of motion.  Neurological: She is alert and oriented to person, place, and time.  Skin: Skin is warm and dry. She is not diaphoretic.  Nursing note and vitals reviewed.    ED Treatments / Results  Labs (all labs ordered are listed, but only abnormal results are displayed) Labs Reviewed - No data to display  EKG  EKG Interpretation None       Radiology No results found.  Procedures Procedures (including critical care time)  Medications Ordered in ED Medications - No data to display   Initial Impression / Assessment and Plan / ED Course  I have reviewed the triage vital signs and the nursing notes.  Pertinent labs & imaging results that were available during my care of the patient were reviewed by me and considered in my medical decision making (see chart for details).  Acute abdominal series reveals stool filled colon, however no evidence for obstruction. She was given a soapsuds enema  with good results. She is now feeling better and wants to go home. I will recommend magnesium citrate and return as needed for any problems.  Final Clinical Impressions(s) / ED Diagnoses   Final diagnoses:  None    New Prescriptions New Prescriptions   No medications on file     Veryl Speak, MD 01/11/17 [phone]

## 2017-01-11 NOTE — ED Triage Notes (Signed)
Pt reports being post op day 11 from abd surgery and has been having constipation without any relief from OTC medications. Pt states she did digital disimpaction and was able to get small amount out but still feels uncomfortable.

## 2017-01-11 NOTE — Discharge Instructions (Signed)
Magnesium citrate: Drank the entire 10 ounce bottle mixed with equal parts Sprite or Gatorade for relief of constipation.  Return to the emergency department if you develop worsening pain, high fevers, bloody stools, or other new and concerning symptoms.

## 2017-03-11 DIAGNOSIS — D251 Intramural leiomyoma of uterus: Secondary | ICD-10-CM | POA: Diagnosis not present

## 2017-03-11 DIAGNOSIS — E288 Other ovarian dysfunction: Secondary | ICD-10-CM | POA: Diagnosis not present

## 2017-03-11 DIAGNOSIS — D250 Submucous leiomyoma of uterus: Secondary | ICD-10-CM | POA: Diagnosis not present

## 2017-03-11 DIAGNOSIS — Z3201 Encounter for pregnancy test, result positive: Secondary | ICD-10-CM | POA: Diagnosis not present

## 2017-03-11 DIAGNOSIS — Z3200 Encounter for pregnancy test, result unknown: Secondary | ICD-10-CM | POA: Diagnosis not present

## 2017-03-11 DIAGNOSIS — Z319 Encounter for procreative management, unspecified: Secondary | ICD-10-CM | POA: Diagnosis not present

## 2017-03-11 DIAGNOSIS — Z31430 Encounter of female for testing for genetic disease carrier status for procreative management: Secondary | ICD-10-CM | POA: Diagnosis not present

## 2017-04-17 DIAGNOSIS — Z3141 Encounter for fertility testing: Secondary | ICD-10-CM | POA: Diagnosis not present

## 2017-04-17 DIAGNOSIS — Z319 Encounter for procreative management, unspecified: Secondary | ICD-10-CM | POA: Diagnosis not present

## 2017-04-17 DIAGNOSIS — N8500 Endometrial hyperplasia, unspecified: Secondary | ICD-10-CM | POA: Diagnosis not present

## 2017-04-17 DIAGNOSIS — Z113 Encounter for screening for infections with a predominantly sexual mode of transmission: Secondary | ICD-10-CM | POA: Diagnosis not present

## 2017-04-22 MED FILL — METHYLPREDNISOLONE 16 MG TA: 16 | 4 days supply | Qty: 4 | Fill #0

## 2017-04-22 MED FILL — CETROTIDE 0.25 MG KIT: 0.25 | 4 days supply | Qty: 4 | Fill #0

## 2017-04-22 MED FILL — SHARPS COLLECTOR 1.4QT: 30 days supply | Qty: 1 | Fill #0

## 2017-04-22 MED FILL — DOXYCYCLINE HYCLATE 100 MG: 100 | 20 days supply | Qty: 40 | Fill #0

## 2017-04-22 MED FILL — BD NEEDLES 22GX1.5: 22G X 1-1/2 | 30 days supply | Qty: 30 | Fill #0

## 2017-04-22 MED FILL — PREGNYL 10,000 UNITS VIAL: 10000 | 1 days supply | Qty: 1 | Fill #0

## 2017-04-22 MED FILL — BD NEEDLES 22GX1.5": 22G X 1-1/2 | 30 days supply | Qty: 30 | Fill #0

## 2017-04-22 MED FILL — BD NEEDLES 30GX0.5": 30G X 1/2" | 25 days supply | Qty: 25 | Fill #0

## 2017-04-22 MED FILL — ESTRADIOL 2 MG TABLET: 2 | 30 days supply | Qty: 60 | Fill #0

## 2017-04-22 MED FILL — ESTRADIOL 0.1 MG PATCH: 0.1 | 28 days supply | Qty: 8 | Fill #0

## 2017-04-22 MED FILL — GONAL-F 1,050 UNITS VIAL: 1050 | 3 days supply | Qty: 3 | Fill #0

## 2017-04-22 MED FILL — BD 3 ML SYRINGE 18GX1-1/2: 18G X 1-1/2 | 30 days supply | Qty: 60 | Fill #0

## 2017-04-22 MED FILL — MENOPUR 75 UNIT VIAL: 75 | 10 days supply | Qty: 20 | Fill #0

## 2017-04-22 MED FILL — BD 3 ML SYRINGE 18GX1-1/2": 18G X 1-1/2 | 30 days supply | Qty: 60 | Fill #0

## 2017-04-22 MED FILL — BD NEEDLES 30GX0.5: 30G X 1/2" | 25 days supply | Qty: 25 | Fill #0

## 2017-04-29 DIAGNOSIS — Z3183 Encounter for assisted reproductive fertility procedure cycle: Secondary | ICD-10-CM | POA: Diagnosis not present

## 2017-05-04 DIAGNOSIS — Z3183 Encounter for assisted reproductive fertility procedure cycle: Secondary | ICD-10-CM | POA: Diagnosis not present

## 2017-05-06 DIAGNOSIS — Z8349 Family history of other endocrine, nutritional and metabolic diseases: Secondary | ICD-10-CM | POA: Diagnosis not present

## 2017-05-06 DIAGNOSIS — E041 Nontoxic single thyroid nodule: Secondary | ICD-10-CM | POA: Diagnosis not present

## 2017-05-07 ENCOUNTER — Other Ambulatory Visit: Payer: Self-pay | Admitting: Internal Medicine

## 2017-05-07 DIAGNOSIS — E041 Nontoxic single thyroid nodule: Secondary | ICD-10-CM

## 2017-05-07 MED FILL — GONAL-F 1,050 UNITS VIAL: 1050 | 5 days supply | Qty: 5 | Fill #0

## 2017-05-07 MED FILL — PREGNYL 10,000 UNITS VIAL: 10000 | 1 days supply | Qty: 1 | Fill #0

## 2017-05-07 MED FILL — MENOPUR 75 UNIT VIAL: 75 | 30 days supply | Qty: 30 | Fill #0

## 2017-05-07 MED FILL — CETROTIDE 0.25 MG KIT: 0.25 | 5 days supply | Qty: 5 | Fill #0

## 2017-05-08 DIAGNOSIS — Z3183 Encounter for assisted reproductive fertility procedure cycle: Secondary | ICD-10-CM | POA: Diagnosis not present

## 2017-05-11 DIAGNOSIS — Z3183 Encounter for assisted reproductive fertility procedure cycle: Secondary | ICD-10-CM | POA: Diagnosis not present

## 2017-05-13 DIAGNOSIS — Z3183 Encounter for assisted reproductive fertility procedure cycle: Secondary | ICD-10-CM | POA: Diagnosis not present

## 2017-05-15 DIAGNOSIS — Z3183 Encounter for assisted reproductive fertility procedure cycle: Secondary | ICD-10-CM | POA: Diagnosis not present

## 2017-05-17 DIAGNOSIS — N711 Chronic inflammatory disease of uterus: Secondary | ICD-10-CM | POA: Diagnosis not present

## 2017-05-17 DIAGNOSIS — Z3183 Encounter for assisted reproductive fertility procedure cycle: Secondary | ICD-10-CM | POA: Diagnosis not present

## 2017-05-18 DIAGNOSIS — Z3141 Encounter for fertility testing: Secondary | ICD-10-CM | POA: Diagnosis not present

## 2017-05-22 DIAGNOSIS — Z3183 Encounter for assisted reproductive fertility procedure cycle: Secondary | ICD-10-CM | POA: Diagnosis not present

## 2017-05-27 ENCOUNTER — Ambulatory Visit
Admission: RE | Admit: 2017-05-27 | Discharge: 2017-05-27 | Disposition: A | Discharge status: Home / Self Care | Payer: Self-pay | Source: Ambulatory Visit | Attending: Internal Medicine | Admitting: Internal Medicine

## 2017-05-27 DIAGNOSIS — E041 Nontoxic single thyroid nodule: Secondary | ICD-10-CM

## 2017-05-27 DIAGNOSIS — E042 Nontoxic multinodular goiter: Secondary | ICD-10-CM | POA: Diagnosis not present

## 2017-06-02 DIAGNOSIS — Z3183 Encounter for assisted reproductive fertility procedure cycle: Secondary | ICD-10-CM | POA: Diagnosis not present

## 2017-06-02 DIAGNOSIS — Z319 Encounter for procreative management, unspecified: Secondary | ICD-10-CM | POA: Diagnosis not present

## 2017-06-10 DIAGNOSIS — Z3183 Encounter for assisted reproductive fertility procedure cycle: Secondary | ICD-10-CM | POA: Diagnosis not present

## 2017-07-01 DIAGNOSIS — Z3183 Encounter for assisted reproductive fertility procedure cycle: Secondary | ICD-10-CM | POA: Diagnosis not present

## 2017-07-01 MED FILL — BD NEEDLES 30GX0.5": 30G X 1/2" | 25 days supply | Qty: 25 | Fill #1

## 2017-07-01 MED FILL — SHARPS COLLECTOR 1.4QT: 30 days supply | Qty: 1 | Fill #0

## 2017-07-01 MED FILL — BD NEEDLES 30GX0.5: 30G X 1/2" | 25 days supply | Qty: 25 | Fill #1

## 2017-07-06 DIAGNOSIS — Z3183 Encounter for assisted reproductive fertility procedure cycle: Secondary | ICD-10-CM | POA: Diagnosis not present

## 2017-07-08 DIAGNOSIS — Z3183 Encounter for assisted reproductive fertility procedure cycle: Secondary | ICD-10-CM | POA: Diagnosis not present

## 2017-07-11 DIAGNOSIS — Z3183 Encounter for assisted reproductive fertility procedure cycle: Secondary | ICD-10-CM | POA: Diagnosis not present

## 2017-07-14 DIAGNOSIS — Z3183 Encounter for assisted reproductive fertility procedure cycle: Secondary | ICD-10-CM | POA: Diagnosis not present

## 2017-07-19 DIAGNOSIS — Z3183 Encounter for assisted reproductive fertility procedure cycle: Secondary | ICD-10-CM | POA: Diagnosis not present

## 2017-08-05 MED FILL — ESTRADIOL 0.1 MG PATCH: 0.1 | 28 days supply | Qty: 8 | Fill #0

## 2017-09-07 DIAGNOSIS — Z3183 Encounter for assisted reproductive fertility procedure cycle: Secondary | ICD-10-CM | POA: Diagnosis not present

## 2017-09-08 MED FILL — PROGESTERONE OIL 50 MG/ML V: 50 | 30 days supply | Qty: 30 | Fill #0

## 2017-09-11 DIAGNOSIS — Z3183 Encounter for assisted reproductive fertility procedure cycle: Secondary | ICD-10-CM | POA: Diagnosis not present

## 2017-09-16 DIAGNOSIS — N8500 Endometrial hyperplasia, unspecified: Secondary | ICD-10-CM | POA: Diagnosis not present

## 2017-09-16 DIAGNOSIS — N856 Intrauterine synechiae: Secondary | ICD-10-CM | POA: Diagnosis not present

## 2017-09-16 MED FILL — MEDROXYPROGESTERONE 10 MG T: 10 | 5 days supply | Qty: 5 | Fill #0

## 2017-09-16 MED FILL — ESTRADIOL 2 MG TABLET: 2 | 30 days supply | Qty: 120 | Fill #0

## 2017-11-16 DIAGNOSIS — I10 Essential (primary) hypertension: Secondary | ICD-10-CM | POA: Diagnosis not present

## 2017-11-16 DIAGNOSIS — Z319 Encounter for procreative management, unspecified: Secondary | ICD-10-CM | POA: Diagnosis not present

## 2017-11-16 DIAGNOSIS — Z3141 Encounter for fertility testing: Secondary | ICD-10-CM | POA: Diagnosis not present

## 2017-11-16 MED FILL — MEDROXYPROGESTERONE 10 MG T: 10 | 5 days supply | Qty: 5 | Fill #0

## 2017-11-16 MED FILL — DOXYCYCLINE HYCLATE 100 MG: 100 | 5 days supply | Qty: 10 | Fill #0

## 2017-11-16 MED FILL — ESTRADIOL 2 MG TABLET: 2 | 10 days supply | Qty: 40 | Fill #0

## 2017-12-02 MED FILL — METHYLPREDNISOLONE 4 MG TAB: 4 | 4 days supply | Qty: 16 | Fill #0

## 2017-12-02 MED FILL — ESTRADIOL 2 MG TABLET: 2 | 30 days supply | Qty: 60 | Fill #0

## 2017-12-03 MED FILL — ESTRADIOL 0.1 MG PATCH: 0.1 | 28 days supply | Qty: 16 | Fill #0

## 2017-12-11 DIAGNOSIS — Z3183 Encounter for assisted reproductive fertility procedure cycle: Secondary | ICD-10-CM | POA: Diagnosis not present

## 2017-12-18 MED FILL — PROGESTERONE OIL 50 MG/ML V: 50 | 30 days supply | Qty: 30 | Fill #1

## 2017-12-18 MED FILL — BD NEEDLES 22GX1.5: 22G X 1-1/2 | 30 days supply | Qty: 30 | Fill #1

## 2017-12-18 MED FILL — BD 3 ML SYRINGE 18GX1-1/2: 18G X 1-1/2 | 30 days supply | Qty: 60 | Fill #1

## 2017-12-18 MED FILL — BD NEEDLES 22GX1.5": 22G X 1-1/2 | 30 days supply | Qty: 30 | Fill #1

## 2017-12-18 MED FILL — BD 3 ML SYRINGE 18GX1-1/2": 18G X 1-1/2 | 30 days supply | Qty: 60 | Fill #1

## 2017-12-21 MED FILL — SHARPS COLLECTOR 1.4QT: 30 days supply | Qty: 1 | Fill #0

## 2017-12-23 DIAGNOSIS — Z3183 Encounter for assisted reproductive fertility procedure cycle: Secondary | ICD-10-CM | POA: Diagnosis not present

## 2017-12-29 MED FILL — ESTRADIOL 2 MG TABLET: 2 | 30 days supply | Qty: 60 | Fill #1

## 2017-12-31 DIAGNOSIS — Z3200 Encounter for pregnancy test, result unknown: Secondary | ICD-10-CM | POA: Diagnosis not present

## 2018-01-11 DIAGNOSIS — N96 Recurrent pregnancy loss: Secondary | ICD-10-CM | POA: Diagnosis not present

## 2018-02-08 DIAGNOSIS — H5213 Myopia, bilateral: Secondary | ICD-10-CM | POA: Diagnosis not present

## 2018-02-15 DIAGNOSIS — N856 Intrauterine synechiae: Secondary | ICD-10-CM | POA: Diagnosis not present

## 2018-02-15 DIAGNOSIS — Z3141 Encounter for fertility testing: Secondary | ICD-10-CM | POA: Diagnosis not present

## 2018-02-15 DIAGNOSIS — Z113 Encounter for screening for infections with a predominantly sexual mode of transmission: Secondary | ICD-10-CM | POA: Diagnosis not present

## 2018-02-15 DIAGNOSIS — N858 Other specified noninflammatory disorders of uterus: Secondary | ICD-10-CM | POA: Diagnosis not present

## 2018-02-15 MED FILL — MEDROXYPROGESTERONE 10 MG T: 10 | 5 days supply | Qty: 5 | Fill #0

## 2018-02-15 MED FILL — ESTRADIOL 2 MG TABLET: 2 | 30 days supply | Qty: 120 | Fill #0

## 2018-02-15 MED FILL — DOXYCYCLINE HYC 100 MG CAPS: 100 | 15 days supply | Qty: 30 | Fill #0

## 2018-02-22 DIAGNOSIS — Z0000 Encounter for general adult medical examination without abnormal findings: Secondary | ICD-10-CM | POA: Diagnosis not present

## 2018-02-22 DIAGNOSIS — E041 Nontoxic single thyroid nodule: Secondary | ICD-10-CM | POA: Diagnosis not present

## 2018-03-01 ENCOUNTER — Ambulatory Visit (INDEPENDENT_AMBULATORY_CARE_PROVIDER_SITE_OTHER): Payer: 59 | Admitting: Otolaryngology

## 2018-03-01 DIAGNOSIS — E559 Vitamin D deficiency, unspecified: Secondary | ICD-10-CM | POA: Diagnosis not present

## 2018-03-01 DIAGNOSIS — Z0000 Encounter for general adult medical examination without abnormal findings: Secondary | ICD-10-CM | POA: Diagnosis not present

## 2018-03-01 DIAGNOSIS — Z681 Body mass index (BMI) 19 or less, adult: Secondary | ICD-10-CM | POA: Diagnosis not present

## 2018-03-01 DIAGNOSIS — E7849 Other hyperlipidemia: Secondary | ICD-10-CM | POA: Diagnosis not present

## 2018-03-01 DIAGNOSIS — Z1389 Encounter for screening for other disorder: Secondary | ICD-10-CM | POA: Diagnosis not present

## 2018-03-15 DIAGNOSIS — N972 Female infertility of uterine origin: Secondary | ICD-10-CM | POA: Diagnosis not present

## 2018-03-22 ENCOUNTER — Ambulatory Visit (INDEPENDENT_AMBULATORY_CARE_PROVIDER_SITE_OTHER): Payer: 59 | Admitting: Otolaryngology

## 2018-03-22 DIAGNOSIS — K219 Gastro-esophageal reflux disease without esophagitis: Secondary | ICD-10-CM

## 2018-03-22 DIAGNOSIS — H6121 Impacted cerumen, right ear: Secondary | ICD-10-CM | POA: Diagnosis not present

## 2018-03-22 DIAGNOSIS — R05 Cough: Secondary | ICD-10-CM | POA: Diagnosis not present

## 2018-03-22 DIAGNOSIS — J3501 Chronic tonsillitis: Secondary | ICD-10-CM | POA: Diagnosis not present

## 2018-04-12 DIAGNOSIS — N978 Female infertility of other origin: Secondary | ICD-10-CM | POA: Diagnosis not present

## 2018-04-19 DIAGNOSIS — N970 Female infertility associated with anovulation: Secondary | ICD-10-CM | POA: Diagnosis not present

## 2018-04-28 IMAGING — CR DG ABDOMEN ACUTE W/ 1V CHEST
3 series · 3 of 3 positions shown · non-contrast
Comparison: None.

CLINICAL DATA: Postoperative day 11 from abdominal surgery.
Constipation.

EXAM:
DG ABDOMEN ACUTE W/ 1V CHEST

[w chest pa]
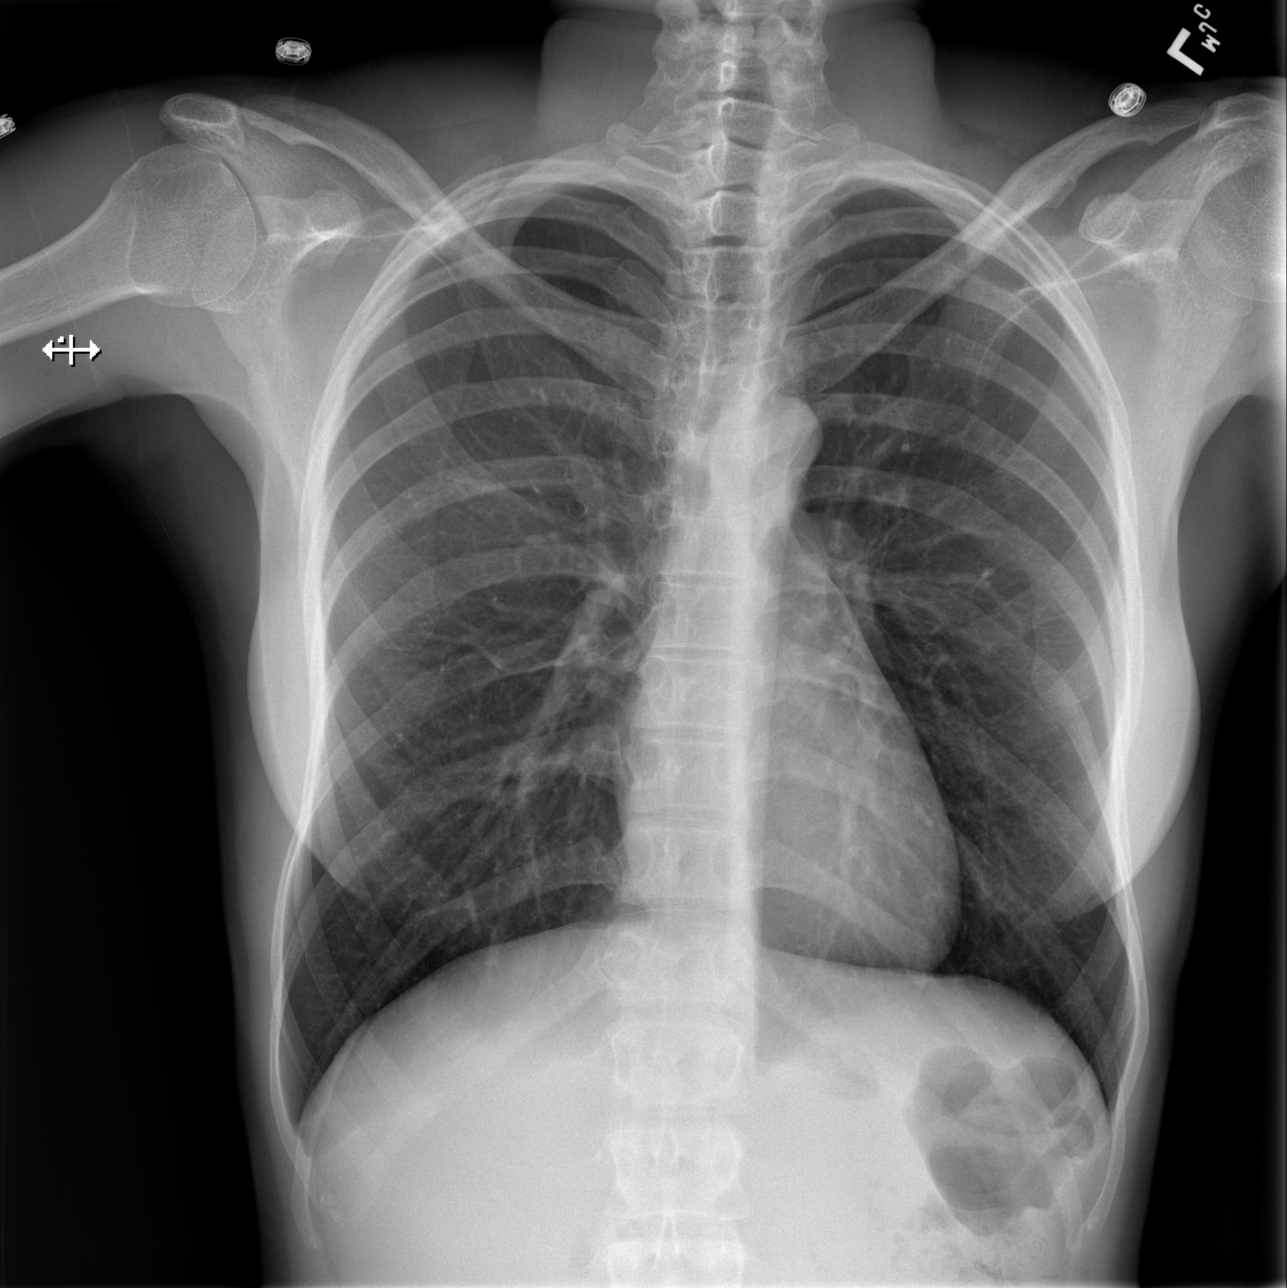

[w abdomen upright]
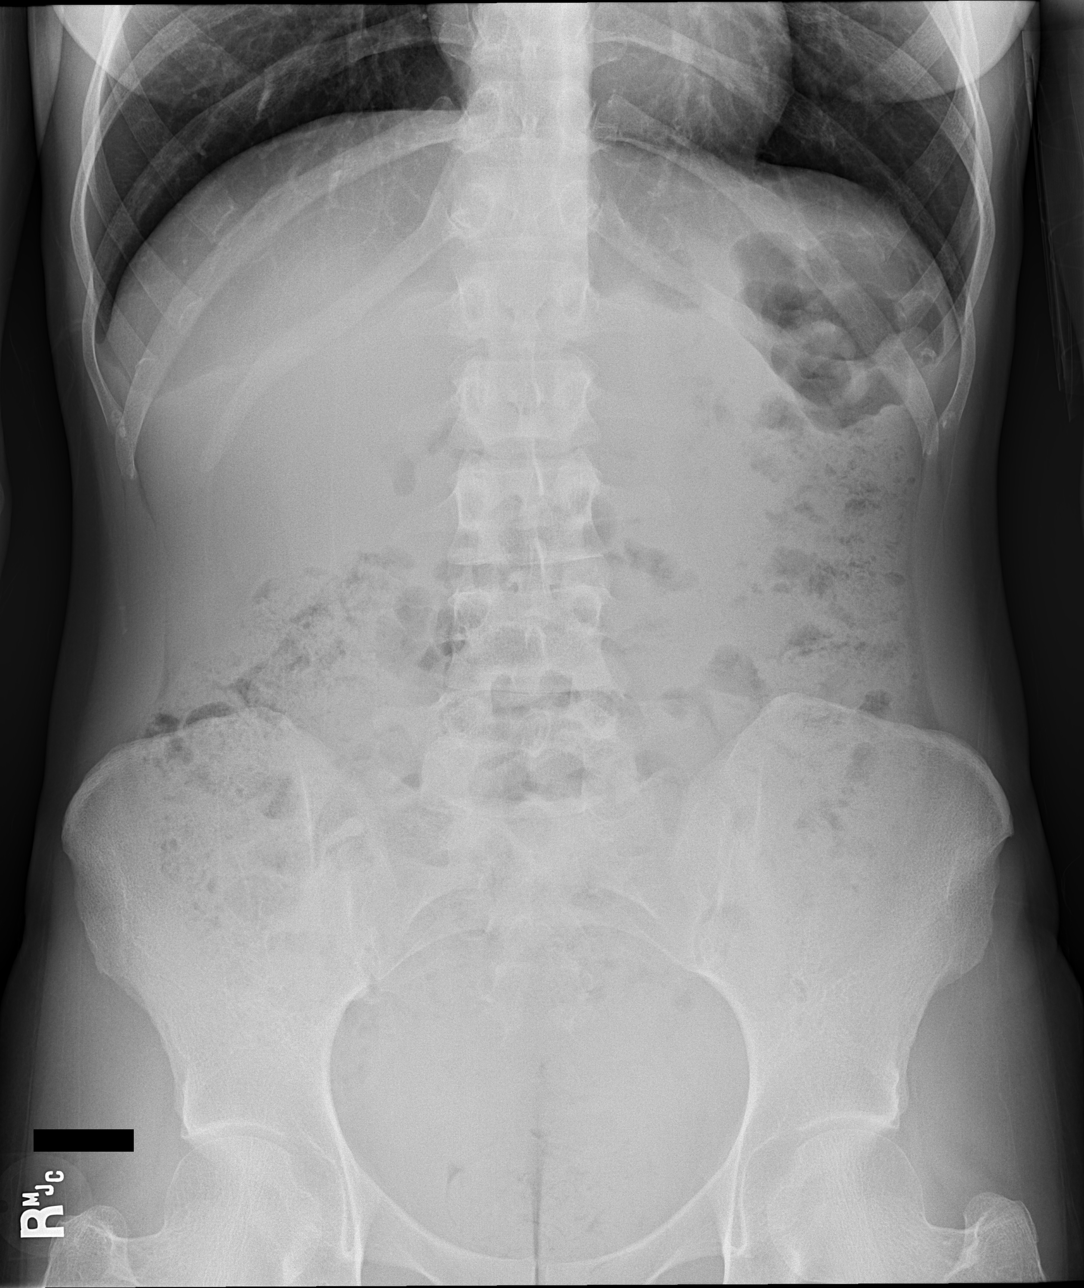

[t abdomen supine]
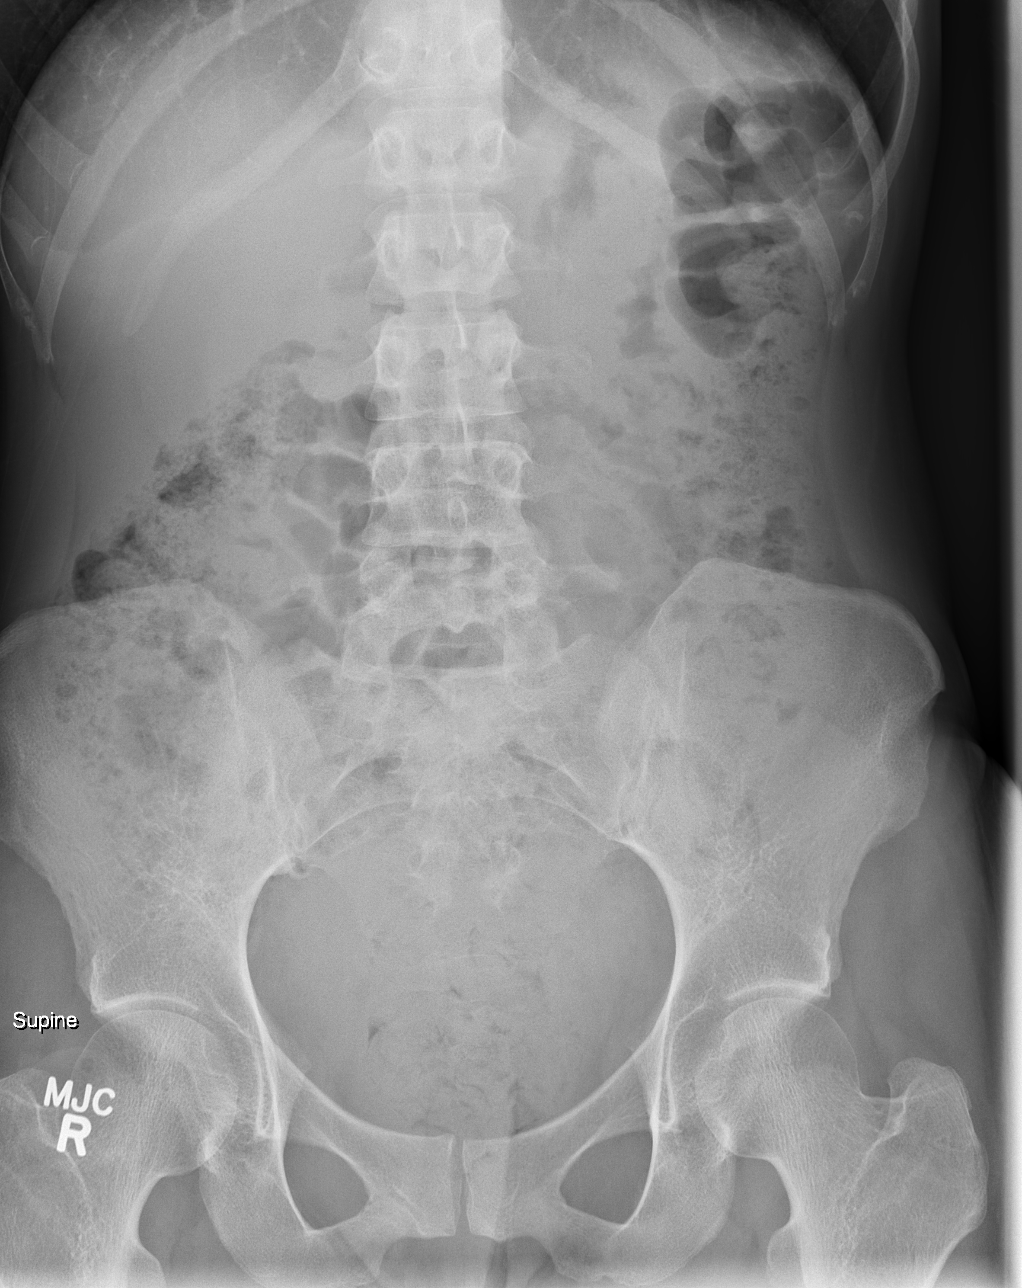

[3 of 3 positions shown; findings below may reference images not displayed]

FINDINGS: Mild hyperinflation. Normal heart size and pulmonary vascularity. No
focal airspace disease or consolidation in the lungs. No blunting of
costophrenic angles. No pneumothorax. Mediastinal contours appear
intact.

Diffusely stool-filled colon. No small or large bowel distention. No
free intra-abdominal air. No abnormal air-fluid levels. No
radiopaque stones. Visualized bones appear intact.
IMPRESSION: No evidence of active pulmonary disease. Nonobstructive bowel gas
pattern with diffusely stool-filled colon.

## 2018-05-11 ENCOUNTER — Other Ambulatory Visit: Payer: Self-pay | Admitting: Internal Medicine

## 2018-05-11 DIAGNOSIS — E041 Nontoxic single thyroid nodule: Secondary | ICD-10-CM

## 2018-05-17 ENCOUNTER — Ambulatory Visit
Admission: RE | Admit: 2018-05-17 | Discharge: 2018-05-17 | Disposition: A | Discharge status: Home / Self Care | Payer: 59 | Source: Ambulatory Visit | Attending: Internal Medicine | Admitting: Internal Medicine

## 2018-05-17 DIAGNOSIS — E042 Nontoxic multinodular goiter: Secondary | ICD-10-CM | POA: Diagnosis not present

## 2018-05-17 DIAGNOSIS — E041 Nontoxic single thyroid nodule: Secondary | ICD-10-CM

## 2018-05-31 DIAGNOSIS — Z3189 Encounter for other procreative management: Secondary | ICD-10-CM | POA: Diagnosis not present

## 2018-06-02 MED FILL — BD NEEDLES 30GX0.5": 30G X 1/2" | 30 days supply | Qty: 60 | Fill #0

## 2018-06-02 MED FILL — GONAL-F 1,050 UNITS VIAL: 1050 | 21 days supply | Qty: 7 | Fill #0

## 2018-06-02 MED FILL — MENOPUR 75 UNIT VIAL: 75 | 23 days supply | Qty: 45 | Fill #0

## 2018-06-02 MED FILL — BD 3 ML SYRINGE 18GX1-1/2: 18G X 1-1/2 | 30 days supply | Qty: 60 | Fill #0

## 2018-06-02 MED FILL — DOXYCYCLINE HYCLATE 100 MG: 100 | 10 days supply | Qty: 20 | Fill #0

## 2018-06-02 MED FILL — ESTRADIOL 0.1 MG PATCH: 0.1 | 28 days supply | Qty: 8 | Fill #0

## 2018-06-02 MED FILL — CETROTIDE 0.25 MG KIT: 0.25 | 10 days supply | Qty: 10 | Fill #0

## 2018-06-02 MED FILL — BD NEEDLES 30GX0.5: 30G X 1/2" | 30 days supply | Qty: 60 | Fill #0

## 2018-06-02 MED FILL — BD 3 ML SYRINGE 18GX1-1/2": 18G X 1-1/2 | 30 days supply | Qty: 60 | Fill #0

## 2018-06-02 MED FILL — SHARPS COLLECTOR 1.4QT: 30 days supply | Qty: 1 | Fill #0

## 2018-06-03 MED FILL — CHORIONIC GONAD 10,000 UNIT: 10000 | 1 days supply | Qty: 1 | Fill #0

## 2018-06-07 DIAGNOSIS — E041 Nontoxic single thyroid nodule: Secondary | ICD-10-CM | POA: Diagnosis not present

## 2018-06-07 DIAGNOSIS — Z8349 Family history of other endocrine, nutritional and metabolic diseases: Secondary | ICD-10-CM | POA: Diagnosis not present

## 2018-06-17 DIAGNOSIS — Z113 Encounter for screening for infections with a predominantly sexual mode of transmission: Secondary | ICD-10-CM | POA: Diagnosis not present

## 2018-06-17 DIAGNOSIS — E2839 Other primary ovarian failure: Secondary | ICD-10-CM | POA: Diagnosis not present

## 2018-06-17 DIAGNOSIS — Z3183 Encounter for assisted reproductive fertility procedure cycle: Secondary | ICD-10-CM | POA: Diagnosis not present

## 2018-06-17 DIAGNOSIS — N96 Recurrent pregnancy loss: Secondary | ICD-10-CM | POA: Diagnosis not present

## 2018-06-21 DIAGNOSIS — Z3183 Encounter for assisted reproductive fertility procedure cycle: Secondary | ICD-10-CM | POA: Diagnosis not present

## 2018-06-21 DIAGNOSIS — E2839 Other primary ovarian failure: Secondary | ICD-10-CM | POA: Diagnosis not present

## 2018-06-21 DIAGNOSIS — Z113 Encounter for screening for infections with a predominantly sexual mode of transmission: Secondary | ICD-10-CM | POA: Diagnosis not present

## 2018-06-21 DIAGNOSIS — N96 Recurrent pregnancy loss: Secondary | ICD-10-CM | POA: Diagnosis not present

## 2018-06-23 DIAGNOSIS — Z113 Encounter for screening for infections with a predominantly sexual mode of transmission: Secondary | ICD-10-CM | POA: Diagnosis not present

## 2018-06-23 DIAGNOSIS — Z3183 Encounter for assisted reproductive fertility procedure cycle: Secondary | ICD-10-CM | POA: Diagnosis not present

## 2018-06-23 DIAGNOSIS — N96 Recurrent pregnancy loss: Secondary | ICD-10-CM | POA: Diagnosis not present

## 2018-06-23 DIAGNOSIS — E2839 Other primary ovarian failure: Secondary | ICD-10-CM | POA: Diagnosis not present

## 2018-06-25 DIAGNOSIS — E2839 Other primary ovarian failure: Secondary | ICD-10-CM | POA: Diagnosis not present

## 2018-06-25 DIAGNOSIS — N96 Recurrent pregnancy loss: Secondary | ICD-10-CM | POA: Diagnosis not present

## 2018-06-25 DIAGNOSIS — Z3183 Encounter for assisted reproductive fertility procedure cycle: Secondary | ICD-10-CM | POA: Diagnosis not present

## 2018-06-28 DIAGNOSIS — Z3183 Encounter for assisted reproductive fertility procedure cycle: Secondary | ICD-10-CM | POA: Diagnosis not present

## 2018-06-28 DIAGNOSIS — N96 Recurrent pregnancy loss: Secondary | ICD-10-CM | POA: Diagnosis not present

## 2018-06-28 DIAGNOSIS — E2839 Other primary ovarian failure: Secondary | ICD-10-CM | POA: Diagnosis not present

## 2018-06-29 DIAGNOSIS — N96 Recurrent pregnancy loss: Secondary | ICD-10-CM | POA: Diagnosis not present

## 2018-06-29 DIAGNOSIS — E2839 Other primary ovarian failure: Secondary | ICD-10-CM | POA: Diagnosis not present

## 2018-06-29 DIAGNOSIS — Z3183 Encounter for assisted reproductive fertility procedure cycle: Secondary | ICD-10-CM | POA: Diagnosis not present

## 2018-06-30 DIAGNOSIS — Z3200 Encounter for pregnancy test, result unknown: Secondary | ICD-10-CM | POA: Diagnosis not present

## 2018-07-02 DIAGNOSIS — Z3183 Encounter for assisted reproductive fertility procedure cycle: Secondary | ICD-10-CM | POA: Diagnosis not present

## 2018-07-07 DIAGNOSIS — Z3183 Encounter for assisted reproductive fertility procedure cycle: Secondary | ICD-10-CM | POA: Diagnosis not present

## 2018-07-08 DIAGNOSIS — Z3183 Encounter for assisted reproductive fertility procedure cycle: Secondary | ICD-10-CM | POA: Diagnosis not present

## 2018-11-11 DIAGNOSIS — Z20828 Contact with and (suspected) exposure to other viral communicable diseases: Secondary | ICD-10-CM | POA: Diagnosis not present

## 2019-01-14 ENCOUNTER — Ambulatory Visit (INDEPENDENT_AMBULATORY_CARE_PROVIDER_SITE_OTHER): Payer: 59 | Admitting: *Deleted

## 2019-01-14 ENCOUNTER — Other Ambulatory Visit: Payer: Self-pay

## 2019-01-14 DIAGNOSIS — Z23 Encounter for immunization: Secondary | ICD-10-CM | POA: Diagnosis not present

## 2019-02-14 DIAGNOSIS — H5213 Myopia, bilateral: Secondary | ICD-10-CM | POA: Diagnosis not present

## 2019-02-21 DIAGNOSIS — Z1389 Encounter for screening for other disorder: Secondary | ICD-10-CM | POA: Diagnosis not present

## 2019-02-21 DIAGNOSIS — Z681 Body mass index (BMI) 19 or less, adult: Secondary | ICD-10-CM | POA: Diagnosis not present

## 2019-02-21 DIAGNOSIS — Z23 Encounter for immunization: Secondary | ICD-10-CM | POA: Diagnosis not present

## 2019-02-21 DIAGNOSIS — Z124 Encounter for screening for malignant neoplasm of cervix: Secondary | ICD-10-CM | POA: Diagnosis not present

## 2019-02-21 DIAGNOSIS — Z01419 Encounter for gynecological examination (general) (routine) without abnormal findings: Secondary | ICD-10-CM | POA: Diagnosis not present

## 2019-02-22 DIAGNOSIS — Z124 Encounter for screening for malignant neoplasm of cervix: Secondary | ICD-10-CM | POA: Diagnosis not present

## 2019-02-28 DIAGNOSIS — E559 Vitamin D deficiency, unspecified: Secondary | ICD-10-CM | POA: Diagnosis not present

## 2019-02-28 DIAGNOSIS — E041 Nontoxic single thyroid nodule: Secondary | ICD-10-CM | POA: Diagnosis not present

## 2019-02-28 DIAGNOSIS — Z0000 Encounter for general adult medical examination without abnormal findings: Secondary | ICD-10-CM | POA: Diagnosis not present

## 2019-02-28 DIAGNOSIS — Z7689 Persons encountering health services in other specified circumstances: Secondary | ICD-10-CM | POA: Diagnosis not present

## 2019-02-28 DIAGNOSIS — D6489 Other specified anemias: Secondary | ICD-10-CM | POA: Diagnosis not present

## 2019-03-07 DIAGNOSIS — F419 Anxiety disorder, unspecified: Secondary | ICD-10-CM | POA: Diagnosis not present

## 2019-03-07 DIAGNOSIS — K625 Hemorrhage of anus and rectum: Secondary | ICD-10-CM | POA: Diagnosis not present

## 2019-03-07 DIAGNOSIS — D649 Anemia, unspecified: Secondary | ICD-10-CM | POA: Diagnosis not present

## 2019-03-07 DIAGNOSIS — E785 Hyperlipidemia, unspecified: Secondary | ICD-10-CM | POA: Diagnosis not present

## 2019-03-07 DIAGNOSIS — E041 Nontoxic single thyroid nodule: Secondary | ICD-10-CM | POA: Diagnosis not present

## 2019-03-07 DIAGNOSIS — Z0000 Encounter for general adult medical examination without abnormal findings: Secondary | ICD-10-CM | POA: Diagnosis not present

## 2019-03-07 DIAGNOSIS — E559 Vitamin D deficiency, unspecified: Secondary | ICD-10-CM | POA: Diagnosis not present

## 2019-03-07 DIAGNOSIS — Z1331 Encounter for screening for depression: Secondary | ICD-10-CM | POA: Diagnosis not present

## 2019-03-07 DIAGNOSIS — Z1339 Encounter for screening examination for other mental health and behavioral disorders: Secondary | ICD-10-CM | POA: Diagnosis not present

## 2019-03-19 DIAGNOSIS — Z1231 Encounter for screening mammogram for malignant neoplasm of breast: Secondary | ICD-10-CM | POA: Diagnosis not present

## 2019-04-04 DIAGNOSIS — Z0000 Encounter for general adult medical examination without abnormal findings: Secondary | ICD-10-CM | POA: Diagnosis not present

## 2019-04-25 DIAGNOSIS — Z0000 Encounter for general adult medical examination without abnormal findings: Secondary | ICD-10-CM | POA: Diagnosis not present

## 2019-04-25 DIAGNOSIS — K625 Hemorrhage of anus and rectum: Secondary | ICD-10-CM | POA: Diagnosis not present

## 2019-04-25 DIAGNOSIS — Z23 Encounter for immunization: Secondary | ICD-10-CM | POA: Diagnosis not present

## 2019-06-13 ENCOUNTER — Other Ambulatory Visit: Payer: Self-pay | Admitting: Internal Medicine

## 2019-06-13 DIAGNOSIS — Z8349 Family history of other endocrine, nutritional and metabolic diseases: Secondary | ICD-10-CM | POA: Diagnosis not present

## 2019-06-13 DIAGNOSIS — E041 Nontoxic single thyroid nodule: Secondary | ICD-10-CM | POA: Diagnosis not present

## 2019-06-20 ENCOUNTER — Other Ambulatory Visit: Payer: 59

## 2019-06-27 ENCOUNTER — Ambulatory Visit
Admission: RE | Admit: 2019-06-27 | Discharge: 2019-06-27 | Disposition: A | Discharge status: Home / Self Care | Payer: 59 | Source: Ambulatory Visit | Attending: Internal Medicine | Admitting: Internal Medicine

## 2019-06-27 DIAGNOSIS — E042 Nontoxic multinodular goiter: Secondary | ICD-10-CM | POA: Diagnosis not present

## 2019-06-27 DIAGNOSIS — E041 Nontoxic single thyroid nodule: Secondary | ICD-10-CM

## 2019-07-05 ENCOUNTER — Other Ambulatory Visit: Payer: Self-pay | Admitting: Internal Medicine

## 2019-07-05 DIAGNOSIS — E042 Nontoxic multinodular goiter: Secondary | ICD-10-CM

## 2019-08-03 ENCOUNTER — Ambulatory Visit
Admission: RE | Admit: 2019-08-03 | Discharge: 2019-08-03 | Disposition: A | Discharge status: Home / Self Care | Payer: 59 | Source: Ambulatory Visit | Attending: Internal Medicine | Admitting: Internal Medicine

## 2019-08-03 ENCOUNTER — Other Ambulatory Visit (HOSPITAL_COMMUNITY)
Admission: RE | Admit: 2019-08-03 | Discharge: 2019-08-03 | Disposition: A | Discharge status: Home / Self Care | Payer: 59 | Source: Ambulatory Visit | Attending: Interventional Radiology | Admitting: Interventional Radiology

## 2019-08-03 DIAGNOSIS — E042 Nontoxic multinodular goiter: Secondary | ICD-10-CM

## 2019-08-03 DIAGNOSIS — E041 Nontoxic single thyroid nodule: Secondary | ICD-10-CM | POA: Diagnosis not present

## 2019-08-05 LAB — CYTOLOGY - NON PAP

## 2019-08-29 DIAGNOSIS — Z23 Encounter for immunization: Secondary | ICD-10-CM | POA: Diagnosis not present

## 2019-09-12 DIAGNOSIS — Z20822 Contact with and (suspected) exposure to covid-19: Secondary | ICD-10-CM | POA: Diagnosis not present

## 2019-09-12 DIAGNOSIS — Z03818 Encounter for observation for suspected exposure to other biological agents ruled out: Secondary | ICD-10-CM | POA: Diagnosis not present

## 2019-12-11 DIAGNOSIS — L03116 Cellulitis of left lower limb: Secondary | ICD-10-CM | POA: Diagnosis not present

## 2020-02-20 DIAGNOSIS — L739 Follicular disorder, unspecified: Secondary | ICD-10-CM | POA: Diagnosis not present

## 2020-03-05 DIAGNOSIS — E041 Nontoxic single thyroid nodule: Secondary | ICD-10-CM | POA: Diagnosis not present

## 2020-03-05 DIAGNOSIS — E785 Hyperlipidemia, unspecified: Secondary | ICD-10-CM | POA: Diagnosis not present

## 2020-03-05 DIAGNOSIS — D649 Anemia, unspecified: Secondary | ICD-10-CM | POA: Diagnosis not present

## 2020-03-05 DIAGNOSIS — E559 Vitamin D deficiency, unspecified: Secondary | ICD-10-CM | POA: Diagnosis not present

## 2020-03-05 DIAGNOSIS — Z0000 Encounter for general adult medical examination without abnormal findings: Secondary | ICD-10-CM | POA: Diagnosis not present

## 2020-03-12 DIAGNOSIS — Z0000 Encounter for general adult medical examination without abnormal findings: Secondary | ICD-10-CM | POA: Diagnosis not present

## 2020-03-12 DIAGNOSIS — G8929 Other chronic pain: Secondary | ICD-10-CM | POA: Diagnosis not present

## 2020-03-12 DIAGNOSIS — M25511 Pain in right shoulder: Secondary | ICD-10-CM | POA: Diagnosis not present

## 2020-03-12 DIAGNOSIS — E785 Hyperlipidemia, unspecified: Secondary | ICD-10-CM | POA: Diagnosis not present

## 2020-03-12 DIAGNOSIS — R82998 Other abnormal findings in urine: Secondary | ICD-10-CM | POA: Diagnosis not present

## 2020-03-12 DIAGNOSIS — E559 Vitamin D deficiency, unspecified: Secondary | ICD-10-CM | POA: Diagnosis not present

## 2020-04-09 DIAGNOSIS — H5213 Myopia, bilateral: Secondary | ICD-10-CM | POA: Diagnosis not present

## 2020-04-30 DIAGNOSIS — M67911 Unspecified disorder of synovium and tendon, right shoulder: Secondary | ICD-10-CM | POA: Diagnosis not present

## 2020-05-07 DIAGNOSIS — L650 Telogen effluvium: Secondary | ICD-10-CM | POA: Diagnosis not present

## 2020-05-07 DIAGNOSIS — L819 Disorder of pigmentation, unspecified: Secondary | ICD-10-CM | POA: Diagnosis not present

## 2020-05-14 DIAGNOSIS — M6281 Muscle weakness (generalized): Secondary | ICD-10-CM | POA: Diagnosis not present

## 2020-05-14 DIAGNOSIS — M25611 Stiffness of right shoulder, not elsewhere classified: Secondary | ICD-10-CM | POA: Diagnosis not present

## 2020-05-14 DIAGNOSIS — M67911 Unspecified disorder of synovium and tendon, right shoulder: Secondary | ICD-10-CM | POA: Diagnosis not present

## 2020-05-21 DIAGNOSIS — M67911 Unspecified disorder of synovium and tendon, right shoulder: Secondary | ICD-10-CM | POA: Diagnosis not present

## 2020-05-21 DIAGNOSIS — M25611 Stiffness of right shoulder, not elsewhere classified: Secondary | ICD-10-CM | POA: Diagnosis not present

## 2020-05-21 DIAGNOSIS — M6281 Muscle weakness (generalized): Secondary | ICD-10-CM | POA: Diagnosis not present

## 2020-06-04 DIAGNOSIS — M67911 Unspecified disorder of synovium and tendon, right shoulder: Secondary | ICD-10-CM | POA: Diagnosis not present

## 2020-06-04 DIAGNOSIS — M6281 Muscle weakness (generalized): Secondary | ICD-10-CM | POA: Diagnosis not present

## 2020-06-04 DIAGNOSIS — M25611 Stiffness of right shoulder, not elsewhere classified: Secondary | ICD-10-CM | POA: Diagnosis not present

## 2020-06-11 DIAGNOSIS — M25611 Stiffness of right shoulder, not elsewhere classified: Secondary | ICD-10-CM | POA: Diagnosis not present

## 2020-06-11 DIAGNOSIS — M67911 Unspecified disorder of synovium and tendon, right shoulder: Secondary | ICD-10-CM | POA: Diagnosis not present

## 2020-06-11 DIAGNOSIS — M6281 Muscle weakness (generalized): Secondary | ICD-10-CM | POA: Diagnosis not present

## 2020-06-12 ENCOUNTER — Other Ambulatory Visit: Payer: Self-pay | Admitting: Internal Medicine

## 2020-06-12 DIAGNOSIS — E041 Nontoxic single thyroid nodule: Secondary | ICD-10-CM

## 2020-06-18 DIAGNOSIS — M25611 Stiffness of right shoulder, not elsewhere classified: Secondary | ICD-10-CM | POA: Diagnosis not present

## 2020-06-18 DIAGNOSIS — E041 Nontoxic single thyroid nodule: Secondary | ICD-10-CM | POA: Diagnosis not present

## 2020-06-18 DIAGNOSIS — Z8349 Family history of other endocrine, nutritional and metabolic diseases: Secondary | ICD-10-CM | POA: Diagnosis not present

## 2020-06-18 DIAGNOSIS — M67911 Unspecified disorder of synovium and tendon, right shoulder: Secondary | ICD-10-CM | POA: Diagnosis not present

## 2020-06-18 DIAGNOSIS — M6281 Muscle weakness (generalized): Secondary | ICD-10-CM | POA: Diagnosis not present

## 2020-06-25 ENCOUNTER — Ambulatory Visit
Admission: RE | Admit: 2020-06-25 | Discharge: 2020-06-25 | Disposition: A | Discharge status: Home / Self Care | Payer: 59 | Source: Ambulatory Visit | Attending: Internal Medicine | Admitting: Internal Medicine

## 2020-06-25 DIAGNOSIS — M25611 Stiffness of right shoulder, not elsewhere classified: Secondary | ICD-10-CM | POA: Diagnosis not present

## 2020-06-25 DIAGNOSIS — M67911 Unspecified disorder of synovium and tendon, right shoulder: Secondary | ICD-10-CM | POA: Diagnosis not present

## 2020-06-25 DIAGNOSIS — M6281 Muscle weakness (generalized): Secondary | ICD-10-CM | POA: Diagnosis not present

## 2020-06-25 DIAGNOSIS — E041 Nontoxic single thyroid nodule: Secondary | ICD-10-CM

## 2020-07-09 DIAGNOSIS — M67911 Unspecified disorder of synovium and tendon, right shoulder: Secondary | ICD-10-CM | POA: Diagnosis not present

## 2020-09-10 DIAGNOSIS — N898 Other specified noninflammatory disorders of vagina: Secondary | ICD-10-CM | POA: Diagnosis not present

## 2020-09-10 DIAGNOSIS — Z681 Body mass index (BMI) 19 or less, adult: Secondary | ICD-10-CM | POA: Diagnosis not present

## 2020-09-10 DIAGNOSIS — Z01419 Encounter for gynecological examination (general) (routine) without abnormal findings: Secondary | ICD-10-CM | POA: Diagnosis not present

## 2020-10-29 DIAGNOSIS — Z7689 Persons encountering health services in other specified circumstances: Secondary | ICD-10-CM | POA: Diagnosis not present

## 2020-10-29 DIAGNOSIS — R1032 Left lower quadrant pain: Secondary | ICD-10-CM | POA: Diagnosis not present

## 2020-10-29 DIAGNOSIS — E785 Hyperlipidemia, unspecified: Secondary | ICD-10-CM | POA: Diagnosis not present

## 2020-11-05 DIAGNOSIS — K5900 Constipation, unspecified: Secondary | ICD-10-CM | POA: Diagnosis not present

## 2020-11-05 DIAGNOSIS — D259 Leiomyoma of uterus, unspecified: Secondary | ICD-10-CM | POA: Diagnosis not present

## 2021-02-11 DIAGNOSIS — L700 Acne vulgaris: Secondary | ICD-10-CM | POA: Diagnosis not present

## 2021-02-11 DIAGNOSIS — D485 Neoplasm of uncertain behavior of skin: Secondary | ICD-10-CM | POA: Diagnosis not present

## 2021-02-11 DIAGNOSIS — H02729 Madarosis of unspecified eye, unspecified eyelid and periocular area: Secondary | ICD-10-CM | POA: Diagnosis not present

## 2021-02-11 DIAGNOSIS — L821 Other seborrheic keratosis: Secondary | ICD-10-CM | POA: Diagnosis not present

## 2021-02-11 DIAGNOSIS — L309 Dermatitis, unspecified: Secondary | ICD-10-CM | POA: Diagnosis not present

## 2021-02-11 DIAGNOSIS — L720 Epidermal cyst: Secondary | ICD-10-CM | POA: Diagnosis not present

## 2021-04-15 DIAGNOSIS — E785 Hyperlipidemia, unspecified: Secondary | ICD-10-CM | POA: Diagnosis not present

## 2021-04-15 DIAGNOSIS — E559 Vitamin D deficiency, unspecified: Secondary | ICD-10-CM | POA: Diagnosis not present

## 2021-04-15 DIAGNOSIS — D649 Anemia, unspecified: Secondary | ICD-10-CM | POA: Diagnosis not present

## 2021-04-15 DIAGNOSIS — E041 Nontoxic single thyroid nodule: Secondary | ICD-10-CM | POA: Diagnosis not present

## 2021-04-22 DIAGNOSIS — Z0000 Encounter for general adult medical examination without abnormal findings: Secondary | ICD-10-CM | POA: Diagnosis not present

## 2021-04-22 DIAGNOSIS — R82998 Other abnormal findings in urine: Secondary | ICD-10-CM | POA: Diagnosis not present

## 2021-04-22 DIAGNOSIS — Z1212 Encounter for screening for malignant neoplasm of rectum: Secondary | ICD-10-CM | POA: Diagnosis not present

## 2021-04-22 DIAGNOSIS — R7989 Other specified abnormal findings of blood chemistry: Secondary | ICD-10-CM | POA: Diagnosis not present

## 2021-04-22 DIAGNOSIS — E041 Nontoxic single thyroid nodule: Secondary | ICD-10-CM | POA: Diagnosis not present

## 2021-04-22 DIAGNOSIS — F419 Anxiety disorder, unspecified: Secondary | ICD-10-CM | POA: Diagnosis not present

## 2021-04-22 DIAGNOSIS — E785 Hyperlipidemia, unspecified: Secondary | ICD-10-CM | POA: Diagnosis not present

## 2021-04-22 DIAGNOSIS — D61818 Other pancytopenia: Secondary | ICD-10-CM | POA: Diagnosis not present

## 2021-04-22 DIAGNOSIS — D649 Anemia, unspecified: Secondary | ICD-10-CM | POA: Diagnosis not present

## 2021-05-08 ENCOUNTER — Telehealth: Payer: Self-pay | Admitting: Hematology

## 2021-05-08 NOTE — Telephone Encounter (Signed)
Scheduled appt per 1/31 referral. Spoke to pt who is aware of appt date and time. Pt requested to sch appt in March. Pt is aware to arrive 15 mins prior to appt time.

## 2021-06-17 ENCOUNTER — Other Ambulatory Visit: Payer: Self-pay | Admitting: Internal Medicine

## 2021-06-17 ENCOUNTER — Ambulatory Visit
Admission: RE | Admit: 2021-06-17 | Discharge: 2021-06-17 | Disposition: A | Discharge status: Home / Self Care | Payer: 59 | Source: Ambulatory Visit | Attending: Internal Medicine | Admitting: Internal Medicine

## 2021-06-17 ENCOUNTER — Other Ambulatory Visit: Payer: Self-pay

## 2021-06-17 DIAGNOSIS — E041 Nontoxic single thyroid nodule: Secondary | ICD-10-CM

## 2021-06-17 DIAGNOSIS — E042 Nontoxic multinodular goiter: Secondary | ICD-10-CM | POA: Diagnosis not present

## 2021-06-17 DIAGNOSIS — Z8349 Family history of other endocrine, nutritional and metabolic diseases: Secondary | ICD-10-CM | POA: Diagnosis not present

## 2021-06-21 ENCOUNTER — Telehealth (INDEPENDENT_AMBULATORY_CARE_PROVIDER_SITE_OTHER): Payer: 59 | Admitting: Plastic Surgery

## 2021-06-21 ENCOUNTER — Encounter: Payer: Self-pay | Admitting: Plastic Surgery

## 2021-06-21 DIAGNOSIS — Z719 Counseling, unspecified: Secondary | ICD-10-CM

## 2021-06-21 NOTE — Progress Notes (Signed)
? ?  Subjective:  ? ? Patient ID: Kelsey Reynolds, female    DOB: January 24, 1981, 41 y.o.   MRN: [id number] ? ?Patient is a 40 year old female joining me by televisit visit.  She underwent eyebrow tattooing about a week and a half ago.  She noticed some irregularities and had it revised with saline for reversal.  She then developed scabbing and is very concerned about permanent scar.  She is otherwise healthy and does not have any other major issues or medical conditions.  She has been using Aquaphor or Vaseline on it to keep it moist. ? ? ? ? ?Review of Systems  ?Constitutional:  Negative for activity change.  ?Eyes: Negative.   ?Respiratory: Negative.    ? ?   ?Objective:  ? Physical Exam ?   ?Assessment & Plan:  ? ?  ICD-10-CM   ?1. Encounter for counseling  Z71.9   ?  ?  ?I shared with the patient Cassie's information and recommend she reach out to her.  She has dealt with these types of situations in the past.  I also encouraged her to let me know how it goes. ?

## 2021-06-24 ENCOUNTER — Inpatient Hospital Stay: Payer: 59 | Attending: Hematology | Admitting: Hematology

## 2021-06-24 ENCOUNTER — Other Ambulatory Visit: Payer: Self-pay

## 2021-06-28 ENCOUNTER — Telehealth: Payer: Self-pay | Admitting: Hematology

## 2021-06-28 NOTE — Telephone Encounter (Signed)
R/s pt's new hem appt. Pt is aware of new appt date and time.  °

## 2021-07-24 ENCOUNTER — Other Ambulatory Visit: Payer: Self-pay | Admitting: Internal Medicine

## 2021-07-24 DIAGNOSIS — E041 Nontoxic single thyroid nodule: Secondary | ICD-10-CM

## 2021-08-05 ENCOUNTER — Encounter: Payer: Self-pay | Admitting: Hematology

## 2021-08-05 ENCOUNTER — Inpatient Hospital Stay: Payer: 59

## 2021-08-05 ENCOUNTER — Inpatient Hospital Stay: Payer: 59 | Attending: Hematology | Admitting: Hematology

## 2021-08-05 ENCOUNTER — Other Ambulatory Visit: Payer: Self-pay

## 2021-08-05 VITALS — BP 133/89 | HR 75 | Temp 99.0°F | Resp 20 | Wt 135.8 lb

## 2021-08-05 DIAGNOSIS — Z800 Family history of malignant neoplasm of digestive organs: Secondary | ICD-10-CM | POA: Diagnosis not present

## 2021-08-05 DIAGNOSIS — D649 Anemia, unspecified: Secondary | ICD-10-CM | POA: Insufficient documentation

## 2021-08-05 DIAGNOSIS — D72819 Decreased white blood cell count, unspecified: Secondary | ICD-10-CM | POA: Insufficient documentation

## 2021-08-05 DIAGNOSIS — Z803 Family history of malignant neoplasm of breast: Secondary | ICD-10-CM | POA: Insufficient documentation

## 2021-08-05 DIAGNOSIS — D61818 Other pancytopenia: Secondary | ICD-10-CM | POA: Diagnosis not present

## 2021-08-05 DIAGNOSIS — D696 Thrombocytopenia, unspecified: Secondary | ICD-10-CM

## 2021-08-05 LAB — CMP (CANCER CENTER ONLY)
ALT: 11 U/L (ref 0–44)
AST: 14 U/L — ABNORMAL LOW (ref 15–41)
Albumin: 4.6 g/dL (ref 3.5–5.0)
Alkaline Phosphatase: 36 U/L — ABNORMAL LOW (ref 38–126)
Anion gap: 4 — ABNORMAL LOW (ref 5–15)
BUN: 14 mg/dL (ref 6–20)
CO2: 29 mmol/L (ref 22–32)
Calcium: 9.4 mg/dL (ref 8.9–10.3)
Chloride: 103 mmol/L (ref 98–111)
Creatinine: 0.91 mg/dL (ref 0.44–1.00)
GFR, Estimated: >60 mL/min
Glucose, Bld: 82 mg/dL (ref 70–99)
Potassium: 4.1 mmol/L (ref 3.5–5.1)
Sodium: 136 mmol/L (ref 135–145)
Total Bilirubin: 0.6 mg/dL (ref 0.3–1.2)
Total Protein: 8.1 g/dL (ref 6.5–8.1)

## 2021-08-05 LAB — CBC WITH DIFFERENTIAL/PLATELET
Abs Immature Granulocytes: 0 K/uL (ref 0.00–0.07)
Basophils Absolute: 0 K/uL (ref 0.0–0.1)
Basophils Relative: 1 %
Eosinophils Absolute: 0.2 K/uL (ref 0.0–0.5)
Eosinophils Relative: 7 %
HCT: 36.7 % (ref 36.0–46.0)
Hemoglobin: 12.5 g/dL (ref 12.0–15.0)
Immature Granulocytes: 0 %
Lymphocytes Relative: 39 %
Lymphs Abs: 1.1 K/uL (ref 0.7–4.0)
MCH: 31 pg (ref 26.0–34.0)
MCHC: 34.1 g/dL (ref 30.0–36.0)
MCV: 91.1 fL (ref 80.0–100.0)
Monocytes Absolute: 0.2 K/uL (ref 0.1–1.0)
Monocytes Relative: 8 %
Neutro Abs: 1.2 K/uL — ABNORMAL LOW (ref 1.7–7.7)
Neutrophils Relative %: 45 %
Platelets: 207 K/uL (ref 150–400)
RBC: 4.03 MIL/uL (ref 3.87–5.11)
RDW: 11.7 % (ref 11.5–15.5)
WBC: 2.8 K/uL — ABNORMAL LOW (ref 4.0–10.5)
nRBC: 0 % (ref 0.0–0.2)

## 2021-08-05 LAB — SAVE SMEAR(SSMR), FOR PROVIDER SLIDE REVIEW

## 2021-08-05 LAB — LACTATE DEHYDROGENASE: LDH: 100 U/L (ref 98–192)

## 2021-08-05 LAB — VITAMIN B12: Vitamin B-12: 517 pg/mL (ref 180–914)

## 2021-08-05 LAB — SEDIMENTATION RATE: Sed Rate: 3 mm/h (ref 0–22)

## 2021-08-05 LAB — IMMATURE PLATELET FRACTION: Immature Platelet Fraction: 9.2 % — ABNORMAL HIGH (ref 1.2–8.6)

## 2021-08-05 NOTE — Progress Notes (Addendum)
. ? ? ?HEMATOLOGY/ONCOLOGY CONSULTATION NOTE ? ?Date of Service: 08/05/2021 ? ?Patient Care Team: ?Donnajean Lopes, MD as PCP - General (Internal Medicine) ? ?CHIEF COMPLAINTS/PURPOSE OF CONSULTATION:  ?Pancytopenia on labs in Jan 2023 ? ? 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 female who has been referred to [place] by Dr .[name], [name], MD ? for evaluation and management of cytopenias noted on her labs done in January 2023. ? ?Patient is elderly very healthy and has a history of previous anemia from menstrual losses, multinodular goiter followed by Dr. [name] and history of miscarriage x2 and failed embryo transfer x1 hypercoagulability work-up noted to be negative per patient's report. ?Patient's last labs were in January 2023 and was noted to have a WBC count of 3.67k ANC of 2k platelets of 108k and a hemoglobin of 12.2. ?Patient notes no abnormal bleeding or bruising. ?No fevers no chills no night sweats no unexpected weight loss. ?No new fatigue.[name] Kitchen ?Patient works as a Consulting civil engineer. ?Patient reports these were routine labs and she has no acute new symptoms.  No unexpected sudden weight loss. ?Patient notes never having required blood product transfusions. ?No focal new bone pains. ?As per outside records patient was having some left lower quadrant abdominal discomfort in August 2022 and had a CT of the abdomen and pelvis at the time that showed no acute abnormalities.  Had shown large amount of retained stools and uterine fibroids. ?She denies taking any over-the-counter NSAIDs or chronic PPIs.  Has been on a few over-the-counter hair and nail supplements, collagen supplement and turmeric supplement. ? ?MEDICAL HISTORY:  ?Past Medical History:  ?Diagnosis Date  ? Anemia   ? with last miscarriage  ? Fibroid   ? 12 cm; with other small ones  ? Thyroid atrophy   ? Nodule  ? Vaginal Pap smear, abnormal   ? 41 years old  ? 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 ?SURGICAL HISTORY: ?Past Surgical History:  ?Procedure Laterality Date  ? DILATION AND EVACUATION N/A 10/26/2016  ? Procedure: DILATATION AND EVACUATION;  Surgeon: Cheri Fowler, MD;  Location: [facility];  Service: Gynecology;  Laterality: N/A;  ? HYSTEROSCOPY N/A 12/30/2016  ? Procedure: HYSTEROSCOPY;  Surgeon: Governor Specking, MD;  Location: [facility];  Service: Gynecology;  Laterality: N/A;  ? LAPAROSCOPIC GELPORT ASSISTED MYOMECTOMY N/A 12/30/2016  ? Procedure: Laparascopy, gel port assisted myomectomy, chromotubation, lysis of adhesions, excision of endometriosis,;  Surgeon: Governor Specking, MD;  Location: [facility];  Service: Gynecology;  Laterality: N/A;  ?HPV + , LEEP x 2-in [place] by Dr. [name]. ?Family history of breast cancer-patient was negative for myriad gene panel testing. ?Thyroid nodule bx 2019 left-- benign ? ? ?SOCIAL HISTORY: ?Social History  ? ?Socioeconomic History  ? Marital status: Married  ?  Spouse name: Not on file  ? Number of children: Not on file  ? Years of education: Not on file  ? Highest education level: Not on file  ?Occupational History  ? Not on file  ?Tobacco Use  ? Smoking status: Never  ? Smokeless tobacco: Never  ?Substance and Sexual Activity  ? Alcohol use: Yes  ?  Comment: Occ  ? Drug use: No  ? Sexual activity: Not on file  ?Other Topics Concern  ? Not on file  ?Social History Narrative  ?  Not on file  ? ?Social Determinants of Health  ? ?Financial Resource Strain: Not on file  ?Food Insecurity: Not on file  ?Transportation Needs: Not on file  ?Physical Activity: Not on file  ?Stress: Not on file  ?Social Connections: Not on file  ?Intimate Partner Violence: Not on file  ? ? ?FAMILY HISTORY: ?Mother -died of breast cancer @ 19 yr ( Myriad panel neg in patient at age 77 yrs), hypothyroidism on synthroid ?Dad- ESRD  -HTN/DM2 ?Maternal grandmother hypertension, diabetes type 2, stroke ?Paternal grandmother-scleroderma unspecified cancer, hypertension, diabetes ?Maternal grandfather pancreatic cancer, hypertension, stroke ?Paternal grandfather hypertension, diabetes. ? ?ALLERGIES:  is allergic to penicillins and sulfa antibiotics. ? ?MEDICATIONS:  ?Current Outpatient Medications  ?Medication Sig Dispense Refill  ? Cholecalciferol (VITAMIN D3) 50000 units CAPS Take 1 capsule by mouth once a week.  1  ? ?No current facility-administered medications for this visit.  ?Collagen ?Kristine Royal-- hair/skin/nails ?Tumeric 2 cap ?Hair/skin/nail supplement - natures bounty ? ?REVIEW OF SYSTEMS:   ? ?PHYSICAL EXAMINATION: ?ECOG PERFORMANCE STATUS  ? ?. ?Vitals:  ? 08/05/21 1109  ?BP: 133/89  ?Pulse: 75  ?Resp: 20  ?Temp: 99 ?F (37.2 ?C)  ?SpO2: 100%  ? ?Filed Weights  ? 08/05/21 1109  ?Weight: 135 lb 12.8 oz (61.6 kg)  ? ?.Body mass index is 19.49 kg/m?. ? ?GENERAL:alert, in no acute distress and comfortable ?SKIN: no acute rashes, no significant lesions ?EYES: conjunctiva are pink and non-injected, sclera anicteric ?OROPHARYNX: MMM, no exudates, no oropharyngeal erythema or ulceration ?NECK: supple, no JVD ?LYMPH:  no palpable lymphadenopathy in the cervical, axillary or inguinal regions ?LUNGS: clear to auscultation b/l with normal respiratory effort ?HEART: regular rate & rhythm ?ABDOMEN:  normoactive bowel sounds , non tender, not distended. ?Extremity: no pedal edema ?PSYCH: alert & oriented x 3 with fluent speech ?NEURO: no focal motor/sensory deficits ? ?LABORATORY DATA:  ?I have reviewed the data as listed ? ?. ? ?  Latest Ref Rng & Units 08/05/2021  ? 12:07 PM 10/25/2016  ?  6:10 AM 10/24/2016  ? 11:18 AM  ?CBC  ?WBC 4.0 - 10.5 K/uL 2.8   7.2   15.9    ?Hemoglobin 12.0 - 15.0 g/dL 12.5   10.7   11.5    ?Hematocrit 36.0 - 46.0 % 36.7   31.3   34.0    ?Platelets 150 - 400 K/uL 207   187   233    ? ?.CBC ?   ?Component Value Date/Time  ? WBC  2.8 (L) 08/05/2021 1207  ? RBC 4.03 08/05/2021 1207  ? HGB 12.5 08/05/2021 1207  ? HCT 36.7 08/05/2021 1207  ? PLT 207 08/05/2021 1207  ? MCV 91.1 08/05/2021 1207  ? MCH 31.0 08/05/2021 1207  ? MCHC 34.1 08/05/2021 1207  ? RDW 11.7 08/05/2021 1207  ? LYMPHSABS 1.1 08/05/2021 1207  ? MONOABS 0.2 08/05/2021 1207  ? EOSABS 0.2 08/05/2021 1207  ? BASOSABS 0.0 08/05/2021 1207  ? ? ?. ? ?  Latest Ref Rng & Units 10/24/2016  ? 11:56 AM  ?CMP  ?Creatinine 0.44 - 1.00 mg/dL 1.07    ? ? ? ?RADIOGRAPHIC STUDIES: ?I have personally reviewed the radiological images as listed and agreed with the findings in the report. ?No results found. ? ?ASSESSMENT & PLAN:  ? 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 ?#1 Anemia-normocytic ?Plan ?-Continue vitamin B complex 1 capsule p.o. daily. ? ?#2 thrombocytopenia.  Mild.  Platelet count of 108k. ? ?#3 mild leukopenia ? ?Labs ?-Available labs from January 2023 were discussed in detail with the patient. ?-Mild cytopenias could be related to cyclical changes in allergies.  Discussed the potential etiologies of the patient's cytopenias ?Lab work upis ordered as noted below ?-No other acute new symptoms at this time. ? ?. ?Orders Placed This Encounter  ?Procedures  ? CBC with Differential/Platelet  ?  Standing Status:   Future  ?  Number of Occurrences:   1  ?  Standing Expiration Date:   08/06/2022  ? CMP ([facility] only)  ?  Standing Status:   Future  ?  Number of Occurrences:   1  ?  Standing Expiration Date:   08/06/2022  ? Vitamin B12  ?  Standing Status:   Future  ?  Number of Occurrences:   1  ?  Standing Expiration Date:   08/05/2022  ? Multiple Myeloma Panel (SPEP&IFE w/QIG)  ?  Standing Status:   Future  ?  Number of Occurrences:   1  ?  Standing Expiration Date:   08/06/2022  ? Sedimentation rate  ?  Standing Status:   Future  ?  Number of Occurrences:   1  ?  Standing Expiration Date:   08/06/2022  ? Methylmalonic acid,  serum  ?  Standing Status:   Future  ?  Number of Occurrences:   1  ?  Standing Expiration Date:   08/05/2022  ? Immature Platelet Fraction  ?  Standing Status:   Future  ?  Number of Occurrences:   1  ?  Standing E

## 2021-08-07 LAB — COPPER, SERUM: Copper: 100 ug/dL (ref 80–158)

## 2021-08-07 LAB — METHYLMALONIC ACID, SERUM: Methylmalonic Acid, Quantitative: 183 nmol/L (ref 0–378)

## 2021-08-08 LAB — MULTIPLE MYELOMA PANEL, SERUM
Albumin SerPl Elph-Mcnc: 4.4 g/dL (ref 2.9–4.4)
Albumin/Glob SerPl: 1.4 (ref 0.7–1.7)
Alpha 1: 0.2 g/dL (ref 0.0–0.4)
Alpha2 Glob SerPl Elph-Mcnc: 0.5 g/dL (ref 0.4–1.0)
B-Globulin SerPl Elph-Mcnc: 0.9 g/dL (ref 0.7–1.3)
Gamma Glob SerPl Elph-Mcnc: 1.8 g/dL (ref 0.4–1.8)
Globulin, Total: 3.3 g/dL (ref 2.2–3.9)
IgA: 175 mg/dL (ref 87–352)
IgG (Immunoglobin G), Serum: 1814 mg/dL — ABNORMAL HIGH (ref 586–1602)
IgM (Immunoglobulin M), Srm: 129 mg/dL (ref 26–217)
Total Protein ELP: 7.7 g/dL (ref 6.0–8.5)

## 2021-08-26 ENCOUNTER — Other Ambulatory Visit (HOSPITAL_COMMUNITY)
Admission: RE | Admit: 2021-08-26 | Discharge: 2021-08-26 | Disposition: A | Discharge status: Home / Self Care | Payer: 59 | Source: Ambulatory Visit | Attending: Interventional Radiology | Admitting: Interventional Radiology

## 2021-08-26 ENCOUNTER — Ambulatory Visit
Admission: RE | Admit: 2021-08-26 | Discharge: 2021-08-26 | Disposition: A | Discharge status: Home / Self Care | Payer: 59 | Source: Ambulatory Visit | Attending: Internal Medicine | Admitting: Internal Medicine

## 2021-08-26 DIAGNOSIS — D696 Thrombocytopenia, unspecified: Secondary | ICD-10-CM | POA: Diagnosis not present

## 2021-08-26 DIAGNOSIS — Z800 Family history of malignant neoplasm of digestive organs: Secondary | ICD-10-CM | POA: Diagnosis not present

## 2021-08-26 DIAGNOSIS — E041 Nontoxic single thyroid nodule: Secondary | ICD-10-CM | POA: Diagnosis not present

## 2021-08-26 DIAGNOSIS — D72819 Decreased white blood cell count, unspecified: Secondary | ICD-10-CM | POA: Diagnosis not present

## 2021-08-26 DIAGNOSIS — Z803 Family history of malignant neoplasm of breast: Secondary | ICD-10-CM | POA: Diagnosis not present

## 2021-08-26 DIAGNOSIS — D649 Anemia, unspecified: Secondary | ICD-10-CM | POA: Diagnosis not present

## 2021-08-26 DIAGNOSIS — D61818 Other pancytopenia: Secondary | ICD-10-CM | POA: Diagnosis not present

## 2021-08-27 LAB — CYTOLOGY - NON PAP

## 2021-09-02 DIAGNOSIS — X58XXXA Exposure to other specified factors, initial encounter: Secondary | ICD-10-CM | POA: Diagnosis not present

## 2021-09-02 DIAGNOSIS — S9032XA Contusion of left foot, initial encounter: Secondary | ICD-10-CM | POA: Diagnosis not present

## 2021-09-02 DIAGNOSIS — M7989 Other specified soft tissue disorders: Secondary | ICD-10-CM | POA: Diagnosis not present

## 2021-09-23 DIAGNOSIS — M79672 Pain in left foot: Secondary | ICD-10-CM | POA: Diagnosis not present

## 2021-09-30 DIAGNOSIS — Z1389 Encounter for screening for other disorder: Secondary | ICD-10-CM | POA: Diagnosis not present

## 2021-09-30 DIAGNOSIS — Z01419 Encounter for gynecological examination (general) (routine) without abnormal findings: Secondary | ICD-10-CM | POA: Diagnosis not present

## 2021-09-30 DIAGNOSIS — D251 Intramural leiomyoma of uterus: Secondary | ICD-10-CM | POA: Diagnosis not present

## 2021-09-30 DIAGNOSIS — Z1151 Encounter for screening for human papillomavirus (HPV): Secondary | ICD-10-CM | POA: Diagnosis not present

## 2021-09-30 DIAGNOSIS — Z124 Encounter for screening for malignant neoplasm of cervix: Secondary | ICD-10-CM | POA: Diagnosis not present

## 2021-09-30 DIAGNOSIS — Z681 Body mass index (BMI) 19 or less, adult: Secondary | ICD-10-CM | POA: Diagnosis not present

## 2021-10-10 IMAGING — US US THYROID
1 series · 13 of 25 positions shown · non-contrast
Comparison: 06/27/2019, 08/03/2019

CLINICAL DATA: Prior ultrasound follow-up.

EXAM:
THYROID ULTRASOUND
TECHNIQUE: Ultrasound examination of the thyroid gland and adjacent soft
tissues was performed.

[Series 1: us thyroid · 0.06mm/px · 13 of 48 slices shown]
[im 1/48]
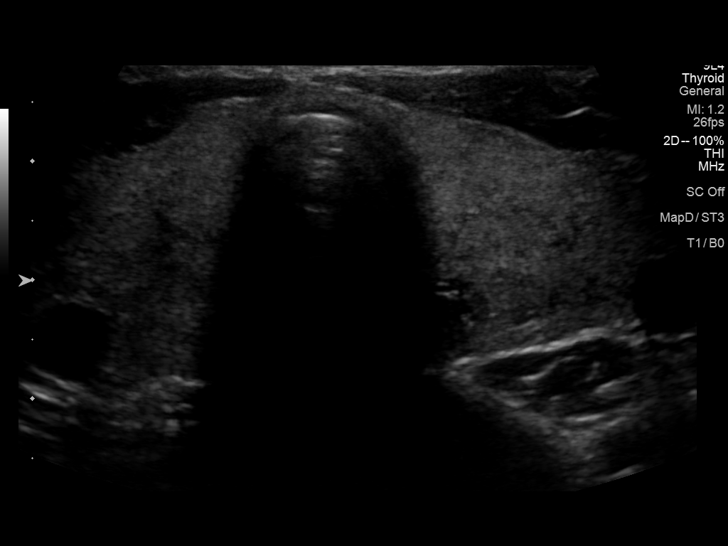
[im 4/48]
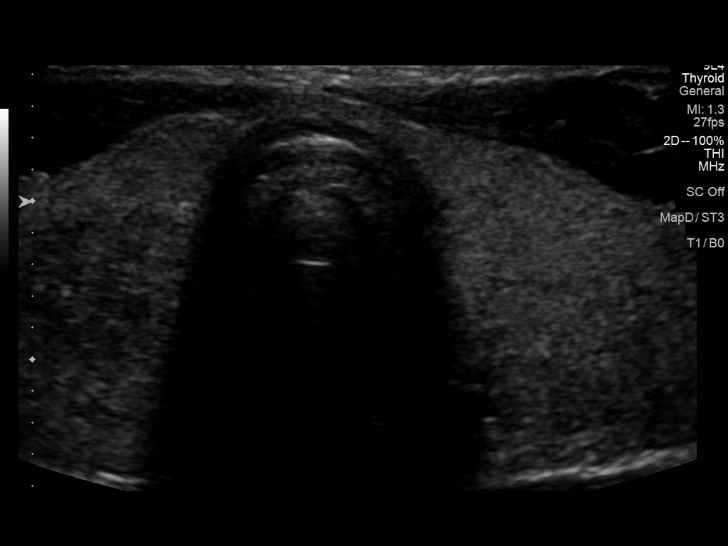
[im 8/48]
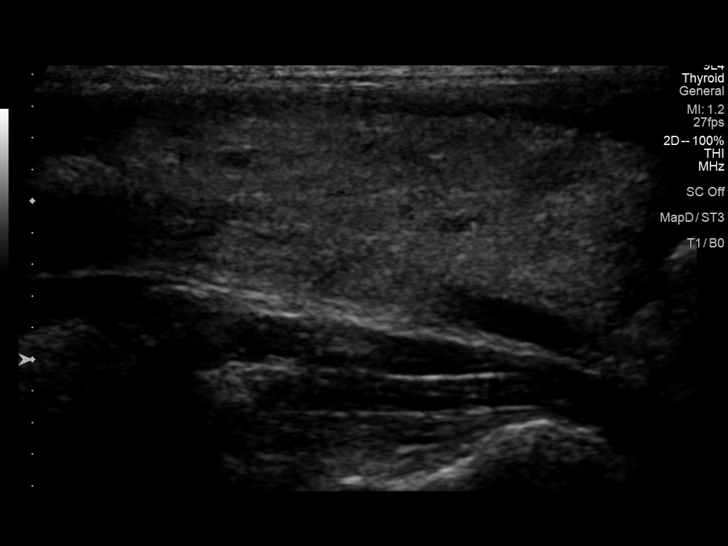
[im 12/48]
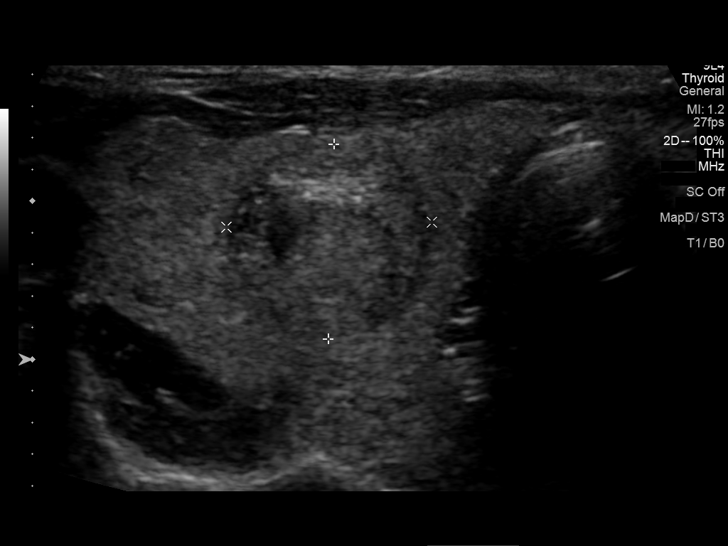
[im 16/48]
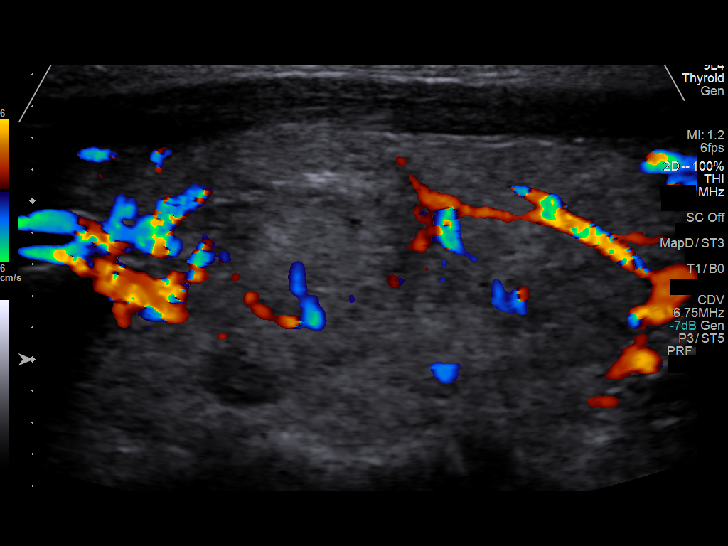
[im 20/48]
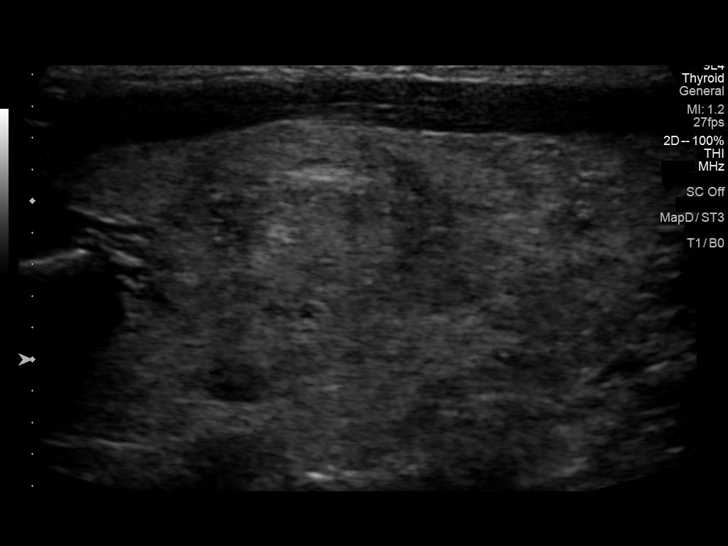
[im 24/48]
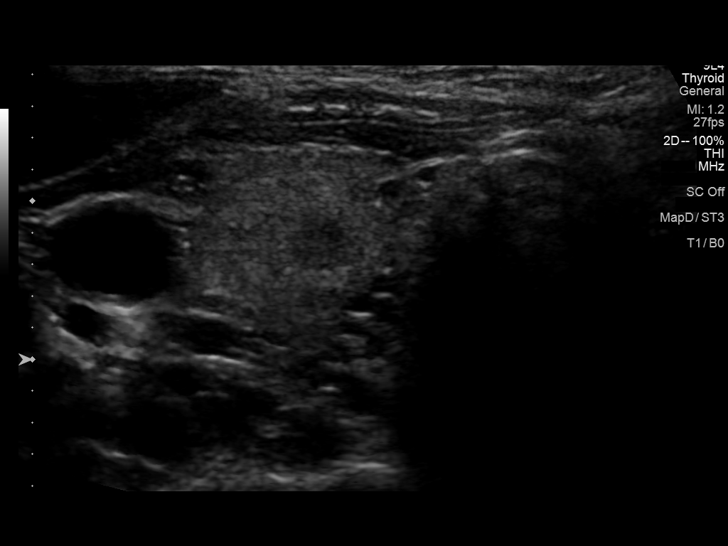
[im 28/48]
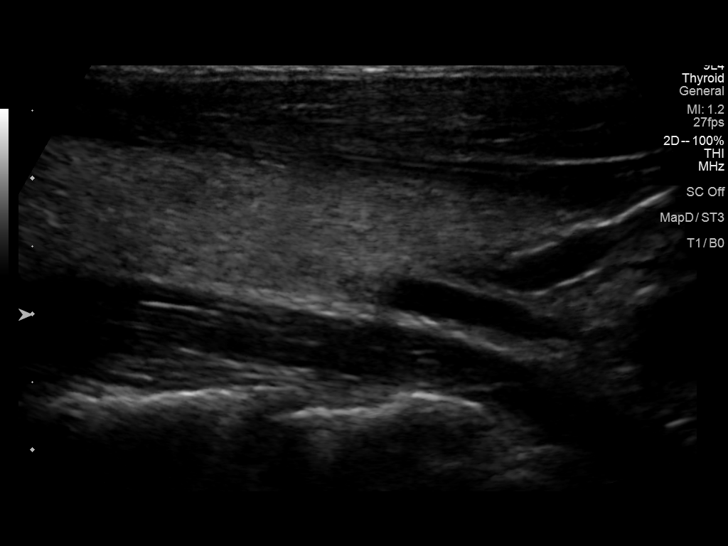
[im 32/48]
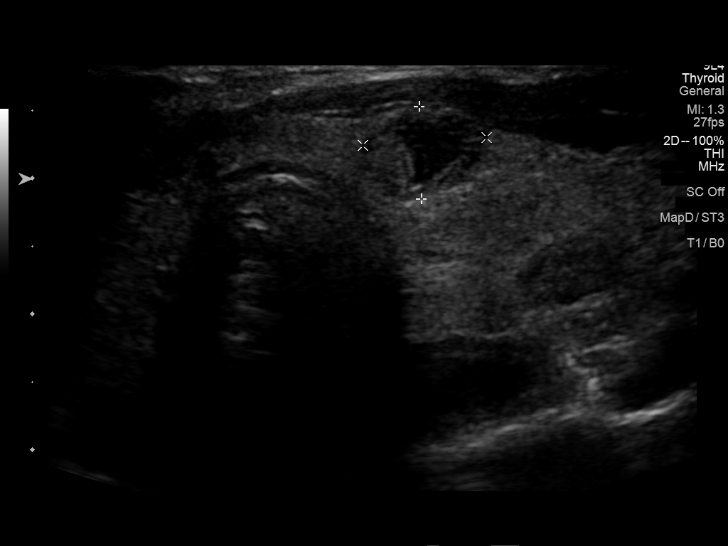
[im 36/48]
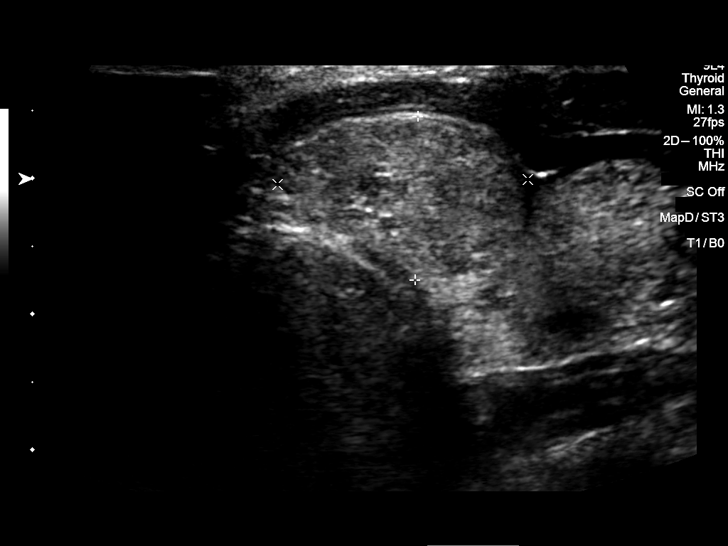
[im 40/48]
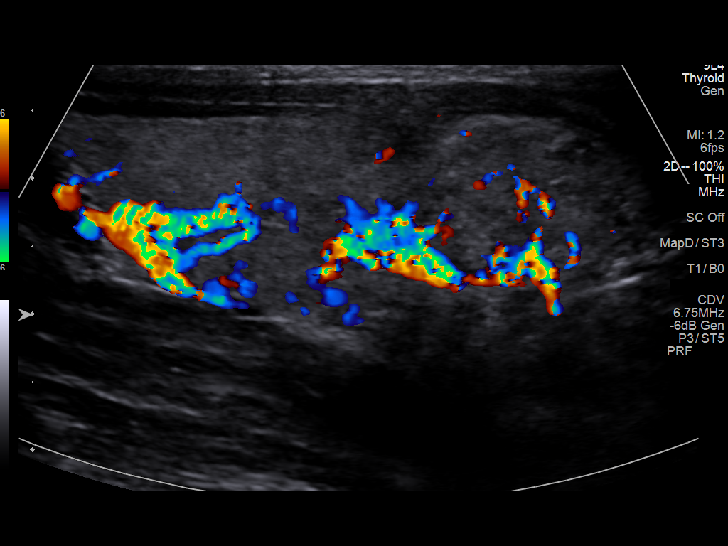
[im 44/48]
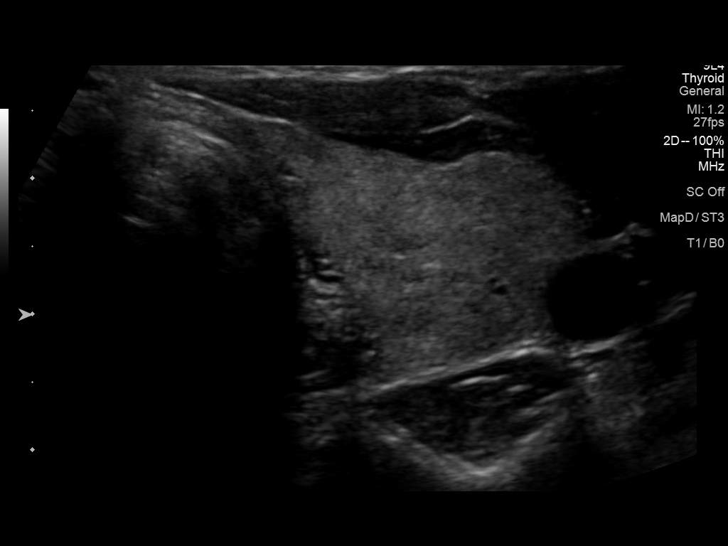
[im 48/48]
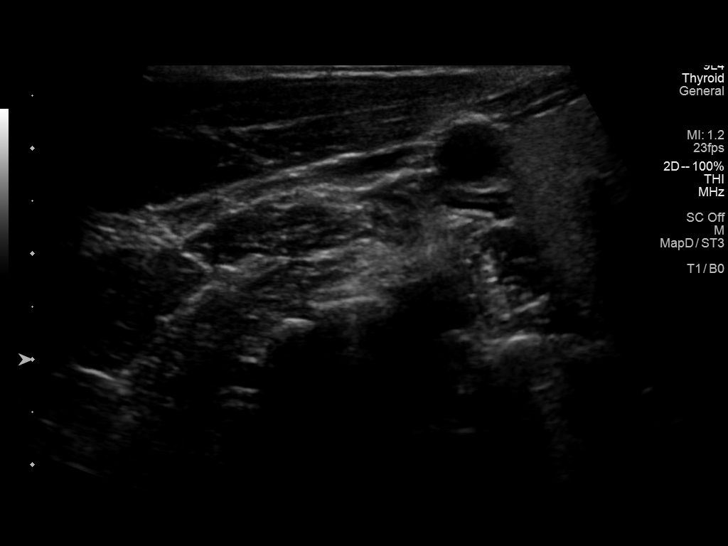

[13 of 25 positions shown; findings below may reference images not displayed]

FINDINGS: Parenchymal Echotexture: Moderately heterogenous

Isthmus: 0.2 cm, previously 0.2 cm

Right lobe: 6.0 x 2.1 x 2.2 cm, previously 5.5 x 1.5 x 2.5 cm

Left lobe: 6.0 x 1.8 x 2.2 cm, previously 6.0 x 1.6 x 2.0 cm

_________________________________________________________

Estimated total number of nodules >/= 1 cm: 2

Number of spongiform nodules >/=  2 cm not described below (TR1): 0

Number of mixed cystic and solid nodules >/= 1.5 cm not described
below (TR2): 0

_________________________________________________________

Similar appearing solid nodule in the right mid thyroid measuring up
to 1.4 cm, previously characterized as TI-RADS category 3 which does
not meet criteria for dedicated ultrasound follow-up or tissue
sampling.

Similar appearing multiple subcentimeter benign appearing nodules in
the left mid inferior thyroid.

The previously biopsied left inferior thyroid nodule measures 2.0 x
1.8 x 1.2 cm, previously 1.6 x 1.4 x 1.1 cm.

_________________________________________________________
IMPRESSION: 1. Similar appearing multinodular goiter.  No new discrete nodules.
2. Unchanged appearance of previously biopsied left inferior thyroid
nodule. Recommend correlation with prior biopsy results.

The above is in keeping with the ACR TI-RADS recommendations - [HOSPITAL] 9661;[DATE].

## 2021-10-21 ENCOUNTER — Telehealth: Payer: Self-pay | Admitting: Hematology

## 2021-10-21 NOTE — Telephone Encounter (Signed)
Rescheduled appointment per template. Patient aware.

## 2021-10-28 ENCOUNTER — Other Ambulatory Visit: Payer: Self-pay | Admitting: Hematology

## 2021-10-28 ENCOUNTER — Other Ambulatory Visit: Payer: Self-pay

## 2021-10-28 ENCOUNTER — Inpatient Hospital Stay: Payer: 59

## 2021-10-28 ENCOUNTER — Inpatient Hospital Stay: Payer: 59 | Attending: Hematology | Admitting: Hematology

## 2021-10-28 VITALS — BP 131/87 | Temp 97.7°F | Resp 20 | Wt 156.6 lb

## 2021-10-28 DIAGNOSIS — D696 Thrombocytopenia, unspecified: Secondary | ICD-10-CM | POA: Insufficient documentation

## 2021-10-28 DIAGNOSIS — D72819 Decreased white blood cell count, unspecified: Secondary | ICD-10-CM | POA: Insufficient documentation

## 2021-10-28 DIAGNOSIS — Z800 Family history of malignant neoplasm of digestive organs: Secondary | ICD-10-CM | POA: Insufficient documentation

## 2021-10-28 DIAGNOSIS — D61818 Other pancytopenia: Secondary | ICD-10-CM | POA: Insufficient documentation

## 2021-10-28 DIAGNOSIS — Z803 Family history of malignant neoplasm of breast: Secondary | ICD-10-CM | POA: Insufficient documentation

## 2021-10-28 DIAGNOSIS — D649 Anemia, unspecified: Secondary | ICD-10-CM

## 2021-10-28 LAB — IRON AND IRON BINDING CAPACITY (CC-WL,HP ONLY)
Iron: 101 ug/dL (ref 28–170)
Saturation Ratios: 33 % — ABNORMAL HIGH (ref 10.4–31.8)
TIBC: 311 ug/dL (ref 250–450)
UIBC: 210 ug/dL (ref 148–442)

## 2021-10-28 LAB — CBC WITH DIFFERENTIAL (CANCER CENTER ONLY)
Abs Immature Granulocytes: 0 10*3/uL (ref 0.00–0.07)
Basophils Absolute: 0 10*3/uL (ref 0.0–0.1)
Basophils Relative: 1 %
Eosinophils Absolute: 0.1 10*3/uL (ref 0.0–0.5)
Eosinophils Relative: 2 %
HCT: 37.4 % (ref 36.0–46.0)
Hemoglobin: 12.5 g/dL (ref 12.0–15.0)
Immature Granulocytes: 0 %
Lymphocytes Relative: 31 %
Lymphs Abs: 1.1 10*3/uL (ref 0.7–4.0)
MCH: 31 pg (ref 26.0–34.0)
MCHC: 33.4 g/dL (ref 30.0–36.0)
MCV: 92.8 fL (ref 80.0–100.0)
Monocytes Absolute: 0.2 10*3/uL (ref 0.1–1.0)
Monocytes Relative: 7 %
Neutro Abs: 2.1 10*3/uL (ref 1.7–7.7)
Neutrophils Relative %: 59 %
Platelet Count: 221 10*3/uL (ref 150–400)
RBC: 4.03 MIL/uL (ref 3.87–5.11)
RDW: 11.7 % (ref 11.5–15.5)
WBC Count: 3.6 10*3/uL — ABNORMAL LOW (ref 4.0–10.5)
nRBC: 0 % (ref 0.0–0.2)

## 2021-10-28 LAB — IMMATURE PLATELET FRACTION: Immature Platelet Fraction: 8.3 % (ref 1.2–8.6)

## 2021-10-28 LAB — FERRITIN: Ferritin: 58 ng/mL (ref 11–307)

## 2021-10-28 LAB — HIV ANTIBODY (ROUTINE TESTING W REFLEX): HIV Screen 4th Generation wRfx: NONREACTIVE

## 2021-10-28 LAB — HEPATITIS C ANTIBODY: HCV Ab: REACTIVE — AB

## 2021-10-28 NOTE — Progress Notes (Signed)
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  Patient is elderly very healthy and has a history of previous anemia from menstrual losses, multinodular goiter followed by Dr. Buddy Reynolds and history of miscarriage x2 and failed embryo transfer x1 hypercoagulability work-up noted to be negative per patient's report. Patient's last labs were in January 2023 and was noted to have a WBC count of 3.67k ANC of 2k platelets of 108k and a hemoglobin of 12.2. Patient notes no abnormal bleeding or bruising. No fevers no chills no night sweats no unexpected weight loss. No new fatigue.. Patient works as a Consulting civil engineer. Patient reports these were routine labs and she has no acute new symptoms.  No unexpected sudden weight loss. Patient notes never having required blood product transfusions. No focal new bone pains. As per outside records patient was having some left lower quadrant abdominal discomfort in August 2022 and had a CT of the abdomen and pelvis at the time that showed no acute abnormalities.  Had shown large amount of retained stools and uterine fibroids. She denies taking any over-the-counter NSAIDs or chronic PPIs.  Has been on a few over-the-counter hair and nail supplements, collagen supplement and turmeric supplement.  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 She reports She is doing well.  She notes some minor fatigue. She notes that she works 12 hour days and has a 40  year old.  No other new or acute focal symptoms.  Labs done 08/05/2021 were reviewed in detail.  MEDICAL HISTORY:  Past Medical History:  Diagnosis Date   Anemia    with last miscarriage   Fibroid    12 cm; with other small ones   Thyroid atrophy    Nodule   Vaginal Pap smear, abnormal    41 years old   Vitamin D deficiency   Dr Kelsey Reynolds- [facility] Miscarriage x 2 , failed embryo transfer )hypercoag w/u neg) Right shoulder impingement syndrome  SURGICAL HISTORY: Past Surgical History:  Procedure Laterality Date   DILATION AND EVACUATION N/A 10/26/2016   Procedure: DILATATION AND EVACUATION;  Surgeon: Kelsey Fowler, MD;  Location: [facility];  Service: Gynecology;  Laterality: N/A;   HYSTEROSCOPY N/A 12/30/2016   Procedure: HYSTEROSCOPY;  Surgeon: Kelsey Specking, MD;  Location: [facility];  Service: Gynecology;  Laterality: N/A;   LAPAROSCOPIC GELPORT ASSISTED MYOMECTOMY N/A 12/30/2016   Procedure: Laparascopy, gel port assisted myomectomy, chromotubation, lysis of adhesions, excision of endometriosis,;  Surgeon: Kelsey Specking, MD;  Location: [facility];  Service: Gynecology;  Laterality: N/A;  HPV + , LEEP x 2-in [place] by Dr. Dorian Reynolds. Family history of breast cancer-patient was negative for myriad gene panel testing. Thyroid nodule bx 2019 left-- benign   SOCIAL HISTORY: Social History   Socioeconomic History   Marital status: Married    Spouse name: Not on file   Number of children: Not on file   Years of education: Not on file   Highest education level: Not on  file  Occupational History   Not on file  Tobacco Use   Smoking status: Never   Smokeless tobacco: Never  Substance and Sexual Activity   Alcohol use: Yes    Comment: Occ   Drug use: No   Sexual activity: Not on file  Other Topics Concern   Not on file  Social History Narrative   Not on file   Social Determinants of Health   Financial Resource Strain: Not on  file  Food Insecurity: Not on file  Transportation Needs: Not on file  Physical Activity: Not on file  Stress: Not on file  Social Connections: Not on file  Intimate Partner Violence: Not on file    FAMILY HISTORY: Mother -died of breast cancer @ 32 yr ( Myriad panel neg in patient at age 85 yrs), hypothyroidism on synthroid Dad- ESRD -HTN/DM2 Maternal grandmother hypertension, diabetes type 2, stroke Paternal grandmother-scleroderma unspecified cancer, hypertension, diabetes Maternal grandfather pancreatic cancer, hypertension, stroke Paternal grandfather hypertension, diabetes.  ALLERGIES:  is allergic to penicillins and sulfa antibiotics.  MEDICATIONS:  Current Outpatient Medications  Medication Sig Dispense Refill   Cholecalciferol (VITAMIN D3) 50000 units CAPS Take 1 capsule by mouth once a week.  1   No current facility-administered medications for this visit.  Collagen Kristine Royal-- hair/skin/nails Tumeric 2 cap Hair/skin/nail supplement - natures bounty  REVIEW OF SYSTEMS:    PHYSICAL EXAMINATION: ECOG PERFORMANCE STATUS   . Vitals:   10/28/21 1000  BP: 131/87  Resp: 20  Temp: 97.7 F (36.5 C)  SpO2: 100%    Filed Weights   10/28/21 1000  Weight: 156 lb 9.6 oz (71 kg)    .Body mass index is 22.47 kg/m. NAD GENERAL:alert, in no acute distress and comfortable SKIN: no acute rashes, no significant lesions EYES: conjunctiva are pink and non-injected, sclera anicteric NECK: supple, no JVD LYMPH:  no palpable lymphadenopathy in the cervical, axillary or inguinal regions LUNGS: clear to auscultation b/l with normal respiratory effort HEART: regular rate & rhythm ABDOMEN:  normoactive bowel sounds , non tender, not distended. Extremity: no pedal edema PSYCH: alert & oriented x 3 with fluent speech NEURO: no focal motor/sensory deficits  LABORATORY DATA:  I have reviewed the data as listed  .    Latest Ref Rng & Units 08/05/2021   12:07 PM 10/25/2016     6:10 AM 10/24/2016   11:18 AM  CBC  WBC 4.0 - 10.5 K/uL 2.8  7.2  15.9   Hemoglobin 12.0 - 15.0 g/dL 12.5  10.7  11.5   Hematocrit 36.0 - 46.0 % 36.7  31.3  34.0   Platelets 150 - 400 K/uL 207  187  233    .CBC    Component Value Date/Time   WBC 2.8 (L) 08/05/2021 1207   RBC 4.03 08/05/2021 1207   HGB 12.5 08/05/2021 1207   HCT 36.7 08/05/2021 1207   PLT 207 08/05/2021 1207   MCV 91.1 08/05/2021 1207   MCH 31.0 08/05/2021 1207   MCHC 34.1 08/05/2021 1207   RDW 11.7 08/05/2021 1207   LYMPHSABS 1.1 08/05/2021 1207   MONOABS 0.2 08/05/2021 1207   EOSABS 0.2 08/05/2021 1207   BASOSABS 0.0 08/05/2021 1207    .    Latest Ref Rng & Units 08/05/2021   12:07 PM 10/24/2016   11:56 AM  CMP  Glucose 70 - 99 mg/dL 82    BUN 6 - 20 mg/dL 14    Creatinine 0.44 - 1.00 mg/dL 0.91  1.07  Sodium 135 - 145 mmol/L 136    Potassium 3.5 - 5.1 mmol/L 4.1    Chloride 98 - 111 mmol/L 103    CO2 22 - 32 mmol/L 29    Calcium 8.9 - 10.3 mg/dL 9.4    Total Protein 6.5 - 8.1 g/dL 8.1    Total Bilirubin 0.3 - 1.2 mg/dL 0.6    Alkaline Phos 38 - 126 U/L 36    AST 15 - 41 U/L 14    ALT 0 - 44 U/L 11       RADIOGRAPHIC STUDIES: I have personally reviewed the radiological images as listed and agreed with the findings in the report. No results found.  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 daily.  #2 thrombocytopenia. Platelet count of 207k. Resolved  #3 mild leukopenia  PLAN -Labs done 08/05/2021 were reviewed in detail. CBC WNL except for reduced ANC of 1200. CMP unremarkable. Vitamin B12 at 512. Sed rate is 3. Methylmalonic acid is 183. Immature platelet fraction is 9.2%. Copper is 100. MM panel was normal. -Mild cytopenias could be related to cyclical changes in allergies.  Discussed the potential  etiologies of the patient's cytopenias Lab work upis ordered as noted below -No other acute new symptoms at this time.  FOLLOW UP Labs today RTC with Dr Irene Limbo as needed based on labs  . No orders of the defined types were placed in this encounter. , .The total time spent in the appointment was 20 minutes* .  All of the patient's questions were answered with apparent satisfaction. The patient knows to call the clinic with any problems, questions or concerns.   Sullivan Lone MD MS AAHIVMS [facility] Hematology/Oncology Physician [facility]  .*Total Encounter Time as defined by the Centers for Medicare and Medicaid Services includes, in addition to the face-to-face time of a patient visit (documented in the note above) non-face-to-face time: obtaining and reviewing outside history, ordering and reviewing medications, tests or procedures, care coordination (communications with other health care professionals or caregivers) and documentation in the medical record.  I, Kelsey Reynolds, am acting as scribe for Dr. Sullivan Lone, MD.  .I have reviewed the above documentation for accuracy and completeness, and I agree with the above. Brunetta Genera MD   ADDENDUM  .CBC    Component Value Date/Time   WBC 3.6 (L) 10/28/2021 1049   WBC 2.8 (L) 08/05/2021 1207   RBC 4.03 10/28/2021 1049   HGB 12.5 10/28/2021 1049   HCT 37.4 10/28/2021 1049   PLT 221 10/28/2021 1049   MCV 92.8 10/28/2021 1049   MCH 31.0 10/28/2021 1049   MCHC 33.4 10/28/2021 1049   RDW 11.7 10/28/2021 1049   LYMPHSABS 1.1 10/28/2021 1049   MONOABS 0.2 10/28/2021 1049   EOSABS 0.1 10/28/2021 1049   BASOSABS 0.0 10/28/2021 1049   Component     Latest Ref Rng 10/28/2021  IgG (Immunoglobin G), Serum     586 - 1,602 mg/dL 1,841 (H)   IgA     87 - 352 mg/dL 171   IgM (Immunoglobulin M), Srm     26 - 217 mg/dL 122   Total Protein ELP     6.0 - 8.5 g/dL 7.6 (C)  Albumin SerPl Elph-Mcnc     2.9 - 4.4 g/dL  4.1 (C)  Alpha 1  0.0 - 0.4 g/dL 0.2 (C)  Alpha2 Glob SerPl Elph-Mcnc     0.4 - 1.0 g/dL 0.6 (C)  B-Globulin SerPl Elph-Mcnc     0.7 - 1.3 g/dL 0.9 (C)  Gamma Glob SerPl Elph-Mcnc     0.4 - 1.8 g/dL 1.8 (C)  M Protein SerPl Elph-Mcnc     Not Observed g/dL Not Observed (C)  Globulin, Total     2.2 - 3.9 g/dL 3.5 (C)  Albumin/Glob SerPl     0.7 - 1.7  1.2 (C)  IFE 1 Comment ! (C)  Please Note (HCV): Comment (C)  Iron     28 - 170 ug/dL 101   TIBC     250 - 450 ug/dL 311   Saturation Ratios     10.4 - 31.8 % 33 (H)   UIBC     148 - 442 ug/dL 210   Kappa free light chain     3.3 - 19.4 mg/L 25.6 (H)   Lambda free light chains     5.7 - 26.3 mg/L 16.2   Kappa, lambda light chain ratio     0.26 - 1.65  1.58   Ferritin     11 - 307 ng/mL 58   HCV Ab     NON REACTIVE  Reactive !   HIV Screen 4th Generation wRfx     Non Reactive  Non Reactive     Legend: (H) High ! Abnormal (C) Corrected  Plan Leucopenia/neutropenia --nearly resolved. HIV neg HepC Ab positive --will need to check Hep C VL and genotype and if truelypositive will need ID referral for Hep C  .Brunetta Genera MD

## 2021-10-29 LAB — KAPPA/LAMBDA LIGHT CHAINS
Kappa free light chain: 25.6 mg/L — ABNORMAL HIGH (ref 3.3–19.4)
Kappa, lambda light chain ratio: 1.58 (ref 0.26–1.65)
Lambda free light chains: 16.2 mg/L (ref 5.7–26.3)

## 2021-11-01 LAB — MULTIPLE MYELOMA PANEL, SERUM
Albumin SerPl Elph-Mcnc: 4.1 g/dL (ref 2.9–4.4)
Albumin/Glob SerPl: 1.2 (ref 0.7–1.7)
Alpha 1: 0.2 g/dL (ref 0.0–0.4)
Alpha2 Glob SerPl Elph-Mcnc: 0.6 g/dL (ref 0.4–1.0)
B-Globulin SerPl Elph-Mcnc: 0.9 g/dL (ref 0.7–1.3)
Gamma Glob SerPl Elph-Mcnc: 1.8 g/dL (ref 0.4–1.8)
Globulin, Total: 3.5 g/dL (ref 2.2–3.9)
IgA: 171 mg/dL (ref 87–352)
IgG (Immunoglobin G), Serum: 1841 mg/dL — ABNORMAL HIGH (ref 586–1602)
IgM (Immunoglobulin M), Srm: 122 mg/dL (ref 26–217)
Total Protein ELP: 7.6 g/dL (ref 6.0–8.5)

## 2021-11-02 DIAGNOSIS — Z1231 Encounter for screening mammogram for malignant neoplasm of breast: Secondary | ICD-10-CM | POA: Diagnosis not present

## 2021-11-05 ENCOUNTER — Encounter: Payer: Self-pay | Admitting: Hematology

## 2021-11-05 ENCOUNTER — Ambulatory Visit: Payer: 59

## 2021-11-05 ENCOUNTER — Other Ambulatory Visit: Payer: Self-pay | Admitting: Hematology

## 2021-11-05 DIAGNOSIS — R768 Other specified abnormal immunological findings in serum: Secondary | ICD-10-CM

## 2021-11-05 DIAGNOSIS — D61818 Other pancytopenia: Secondary | ICD-10-CM

## 2021-11-05 NOTE — Progress Notes (Signed)
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 Her blood counts show improvement in leukopenia and neutropenia. She has hepatitis C antibody positive which she is obviously concerned about.  She is concerned that she probably might be exposed from her permanent eyebrows tattooing. 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 Plan -With her consent we have ordered additional confirmatory testing for hepatitis C including quantitative Hep C RNA VL and hepatitis C genotype testing. -We shall also test for hepatitis B as well as for hepatitis B immunity and hepatitis A immunity. -RPR -CMV antibodies -TB QuantiFERON test.  -If hepatitis C diagnosis is confirmed the patient prefers to be referred to Dr. Vickki Muff or Dr. Following him at [facility].  [name]

## 2021-11-06 ENCOUNTER — Inpatient Hospital Stay: Payer: 59 | Attending: Hematology

## 2021-11-06 ENCOUNTER — Other Ambulatory Visit: Payer: 59

## 2021-11-06 ENCOUNTER — Other Ambulatory Visit: Payer: Self-pay

## 2021-11-06 DIAGNOSIS — D61818 Other pancytopenia: Secondary | ICD-10-CM | POA: Diagnosis not present

## 2021-11-06 DIAGNOSIS — R768 Other specified abnormal immunological findings in serum: Secondary | ICD-10-CM

## 2021-11-06 LAB — HEPATITIS A ANTIBODY, TOTAL: hep A Total Ab: NONREACTIVE

## 2021-11-06 LAB — CMP (CANCER CENTER ONLY)
ALT: 13 U/L (ref 0–44)
AST: 14 U/L — ABNORMAL LOW (ref 15–41)
Albumin: 4.9 g/dL (ref 3.5–5.0)
Alkaline Phosphatase: 42 U/L (ref 38–126)
Anion gap: 10 (ref 5–15)
BUN: 15 mg/dL (ref 6–20)
CO2: 26 mmol/L (ref 22–32)
Calcium: 9.5 mg/dL (ref 8.9–10.3)
Chloride: 102 mmol/L (ref 98–111)
Creatinine: 1.02 mg/dL — ABNORMAL HIGH (ref 0.44–1.00)
GFR, Estimated: >60 mL/min (ref >60)
Glucose, Bld: 97 mg/dL (ref 70–99)
Potassium: 3.6 mmol/L (ref 3.5–5.1)
Sodium: 138 mmol/L (ref 135–145)
Total Bilirubin: 0.9 mg/dL (ref 0.3–1.2)
Total Protein: 8.7 g/dL — ABNORMAL HIGH (ref 6.5–8.1)

## 2021-11-06 LAB — HEPATITIS B CORE ANTIBODY, TOTAL: Hep B Core Total Ab: NONREACTIVE

## 2021-11-06 LAB — HEPATITIS B SURFACE ANTIGEN: Hepatitis B Surface Ag: NONREACTIVE

## 2021-11-06 LAB — HEPATITIS B SURFACE ANTIBODY,QUALITATIVE: Hep B S Ab: REACTIVE — AB

## 2021-11-07 LAB — HCV RNA QUANT RFLX ULTRA OR GENOTYP
HCV RNA Qnt(log copy/mL): UNDETERMINED log10 IU/mL
HepC Qn: NOT DETECTED IU/mL

## 2021-11-07 LAB — RPR: RPR Ser Ql: NONREACTIVE

## 2021-11-07 LAB — CMV IGM: CMV IgM: <30 AU/mL (ref 0.0–29.9)

## 2021-11-07 LAB — CMV ANTIBODY, IGG (EIA): CMV Ab - IgG: 1.9 U/mL — ABNORMAL HIGH (ref 0.00–0.59)

## 2021-11-08 ENCOUNTER — Encounter: Payer: Self-pay | Admitting: Hematology

## 2021-11-08 ENCOUNTER — Other Ambulatory Visit: Payer: Self-pay

## 2021-11-08 DIAGNOSIS — R768 Other specified abnormal immunological findings in serum: Secondary | ICD-10-CM

## 2021-11-08 LAB — HEPATITIS C GENOTYPE

## 2021-11-09 LAB — QUANTIFERON-TB GOLD PLUS: QuantiFERON-TB Gold Plus: NEGATIVE

## 2021-11-09 LAB — QUANTIFERON-TB GOLD PLUS (RQFGPL)
QuantiFERON Mitogen Value: >10 IU/mL
QuantiFERON Nil Value: 0.05 IU/mL
QuantiFERON TB1 Ag Value: 0.09 IU/mL
QuantiFERON TB2 Ag Value: 0.08 IU/mL

## 2021-11-13 ENCOUNTER — Other Ambulatory Visit: Payer: Self-pay | Admitting: Hematology

## 2021-11-18 ENCOUNTER — Telehealth: Payer: Self-pay | Admitting: Hematology

## 2021-11-18 NOTE — Telephone Encounter (Signed)
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 Her hepatitis C RNA showed undetectable viral load.  Obviously genotyping was not possible.  We discussed that this could either represent a false positive hep C antibody or previous exposure with clearance of the virus.  LFTs within normal limits.  She would like to be seen by [facility] and a referral was placed and referral has been faxed to them. Other work-up including TB QuantiFERON testing, RPR were negative CMV IgG positive IgM negative suggesting previous infection.  No other hematologic work-up recommended at this time.  Kelsey Reynolds

## 2022-05-01 ENCOUNTER — Encounter: Payer: Self-pay | Admitting: Hematology

## 2022-06-16 ENCOUNTER — Other Ambulatory Visit: Payer: Self-pay | Admitting: Internal Medicine

## 2022-06-16 DIAGNOSIS — E041 Nontoxic single thyroid nodule: Secondary | ICD-10-CM

## 2022-07-16 ENCOUNTER — Other Ambulatory Visit: Payer: Commercial Managed Care - PPO

## 2022-08-06 ENCOUNTER — Ambulatory Visit
Admission: RE | Admit: 2022-08-06 | Discharge: 2022-08-06 | Disposition: A | Discharge status: Home / Self Care | Payer: No Typology Code available for payment source | Source: Ambulatory Visit | Attending: Internal Medicine | Admitting: Internal Medicine

## 2022-08-06 DIAGNOSIS — E041 Nontoxic single thyroid nodule: Secondary | ICD-10-CM

## 2023-06-22 ENCOUNTER — Other Ambulatory Visit: Payer: Self-pay | Admitting: Internal Medicine

## 2023-06-22 DIAGNOSIS — E042 Nontoxic multinodular goiter: Secondary | ICD-10-CM

## 2023-07-08 ENCOUNTER — Ambulatory Visit
Admission: RE | Admit: 2023-07-08 | Discharge: 2023-07-08 | Disposition: A | Discharge status: Home / Self Care | Source: Ambulatory Visit | Attending: Internal Medicine | Admitting: Internal Medicine

## 2023-07-08 DIAGNOSIS — E042 Nontoxic multinodular goiter: Secondary | ICD-10-CM

## 2023-07-10 NOTE — Progress Notes (Signed)
 Chief Complaint: Patient was seen in consultation today for symptomatic thyroid nodule  Referring Physician(s): Kerr,Jeffrey  History of Present Illness: Kelsey Reynolds is a 43 y.o. female with a medical history significant for anemia, uterine fibroids and multinodular goiter. She is familiar to IR from a prior left inferior thyroid nodule biopsy 08/03/19 with aspiration of a small cyst in the left thyroid. A right mid thyroid nodule was biopsied 08/26/21. Both nodules biopsied were benign.   She recently saw Dr. Sharl Ma with endocrinology and she expressed concerns that her thyroid gland is "more obvious." She denies neck pain or trouble swallowing but does wake up with a cough at night.   They discussed options for reducing thyroid nodule volume or removing the thyroid nodule. They discussed surgery, radioactive iodine therapy, needle aspiration, percutaneous ethanol injection and and radiofrequency ablation. She expressed a desire to learn more about radiofrequency ablation and she presents today for further discussion.   Thyroid Symptom Score: 0-10  Thyroid Cosmetic Score:  {thyroid cosmetic score:27407}  Past Medical History:  Diagnosis Date   Anemia    with last miscarriage   Fibroid    12 cm; with other small ones   Thyroid atrophy    Nodule   Vaginal Pap smear, abnormal    43 years old   Vitamin D deficiency     Past Surgical History:  Procedure Laterality Date   DILATION AND EVACUATION N/A 10/26/2016   Procedure: DILATATION AND EVACUATION;  Surgeon: Lavina Hamman, MD;  Location: [facility];  Service: Gynecology;  Laterality: N/A;   HYSTEROSCOPY N/A 12/30/2016   Procedure: HYSTEROSCOPY;  Surgeon: Fermin Schwab, MD;  Location: [facility];  Service: Gynecology;  Laterality: N/A;   LAPAROSCOPIC GELPORT ASSISTED MYOMECTOMY N/A 12/30/2016   Procedure: Laparascopy, gel port assisted myomectomy, chromotubation, lysis of adhesions, excision of  endometriosis,;  Surgeon: Fermin Schwab, MD;  Location: [facility];  Service: Gynecology;  Laterality: N/A;    Allergies: Penicillins and Sulfa antibiotics  Medications: Prior to Admission medications   Medication Sig Start Date End Date Taking? Authorizing Provider  Cholecalciferol (VITAMIN D3) 50000 units CAPS Take 1 capsule by mouth once a week. 07/22/16   [provider]     No family history on file.  Social History   Socioeconomic History   Marital status: Married    Spouse name: Not on file   Number of children: Not on file   Years of education: Not on file   Highest education level: Not on file  Occupational History   Not on file  Tobacco Use   Smoking status: Never   Smokeless tobacco: Never  Substance and Sexual Activity   Alcohol use: Yes    Comment: Occ   Drug use: No   Sexual activity: Not on file  Other Topics Concern   Not on file  Social History Narrative   Not on file   Social Drivers of Health   Financial Resource Strain: Not on file  Food Insecurity: Not on file  Transportation Needs: Not on file  Physical Activity: Not on file  Stress: Not on file  Social Connections: Unknown (08/05/2021)   [facility]   Social Network    Social Network: Not on file     Review of Systems: A 12 point ROS discussed and pertinent positives are indicated in the HPI above.  All other systems are negative.  Vital Signs: There were no vitals taken for this visit.  Physical Exam  Imaging:  US Thyroid 07/08/23         Labs: CBC 04/25/22 Component Ref Range & Units 1 yr ago  WBC (White Blood Cell Count) 3.2 - 9.8 x10^9/L 4.0  Hemoglobin 11.7 - 15.5 g/dL 16.1  Hematocrit 09.6 - 45.0 % 39.0  Platelets 150 - 450 x10^9/L 209  MCV (Mean Corpuscular Volume) 80 - 98 fL 94  MCH (Mean Corpuscular Hemoglobin) 26.5 - 34.0 pg 30.5  MCHC (Mean Corpuscular Hemoglobin Concentration) 31.0 - 36.0 %  32.3  RBC (Red Blood Cell Count) 3.77 - 5.16 x10^12/L 4.13  RDW-CV (Red Cell Distribution Width) 11.5 - 14.5 % 11.8  NRBC (Nucleated Red Blood Cell Count) 0 x10^9/L 0.00  NRBC % (Nucleated Red Blood Cell %) % 0.0  MPV (Mean Platelet Volume) 7.2 - 11.7 fL 11.5    TFTs - 11/29/14 Serum TSH    1.04  Serum free T4    0.83 Thyroperoxidase Antibody    7  Prior Thyroid FNA: 08/03/19 - Left inferior nodule 1.6 cm: Bethesda II 08/26/21 - Right mid nodule 1.9 cm: Bethesda II   Assessment and Plan:  43 year old female with a history of multinodular goiter.   Thank you for this interesting consult.  I greatly enjoyed meeting Kelsey Reynolds and look forward to participating in their care.  A copy of this report was sent to the requesting provider on this date.  Electronically Signed: Mickie Kay, NP 07/10/2023, 2:48 PM   I spent a total of  40 Minutes   in face to face in clinical consultation, greater than 50% of which was counseling/coordinating care for multinodular goiter.

## 2023-07-13 ENCOUNTER — Ambulatory Visit
Admission: RE | Admit: 2023-07-13 | Discharge: 2023-07-13 | Disposition: A | Discharge status: Home / Self Care | Source: Ambulatory Visit | Attending: Internal Medicine | Admitting: Internal Medicine

## 2023-07-13 DIAGNOSIS — E042 Nontoxic multinodular goiter: Secondary | ICD-10-CM

## 2023-07-13 HISTORY — PX: IR RADIOLOGIST EVAL & MGMT: IMG5224

## 2023-07-20 ENCOUNTER — Other Ambulatory Visit: Payer: Self-pay | Admitting: Interventional Radiology

## 2023-07-20 DIAGNOSIS — E041 Nontoxic single thyroid nodule: Secondary | ICD-10-CM

## 2023-08-14 ENCOUNTER — Ambulatory Visit
Admission: RE | Admit: 2023-08-14 | Discharge: 2023-08-14 | Disposition: A | Discharge status: Home / Self Care | Source: Ambulatory Visit | Attending: Interventional Radiology

## 2023-08-14 DIAGNOSIS — E041 Nontoxic single thyroid nodule: Secondary | ICD-10-CM

## 2023-09-17 ENCOUNTER — Other Ambulatory Visit: Payer: Self-pay | Admitting: Internal Medicine

## 2023-09-17 DIAGNOSIS — E042 Nontoxic multinodular goiter: Secondary | ICD-10-CM

## 2023-11-16 ENCOUNTER — Ambulatory Visit
Admission: RE | Admit: 2023-11-16 | Discharge: 2023-11-16 | Disposition: A | Discharge status: Home / Self Care | Source: Ambulatory Visit | Attending: Internal Medicine | Admitting: Internal Medicine

## 2023-11-16 DIAGNOSIS — E042 Nontoxic multinodular goiter: Secondary | ICD-10-CM

## 2023-11-18 ENCOUNTER — Other Ambulatory Visit: Payer: Self-pay | Admitting: Interventional Radiology

## 2023-11-18 DIAGNOSIS — E041 Nontoxic single thyroid nodule: Secondary | ICD-10-CM

## 2023-11-18 NOTE — Progress Notes (Signed)
 This encounter was conducted via the [employer] providing interactive audio and visual communication.  The patient provided verbal consent to conduct a virtual appointment.  The patient was located at their primary residence during this encounter.  Referring Physician(s): Kerr,Jeffrey   Chief Complaint: The patient is seen in virtual video follow up today s/p right mid thyroid  nodule ablation 08/14/23  History of present illness: HPI from initial consultation 07/13/23 Kelsey Reynolds is a 43 y.o. female with a medical history significant for anemia, uterine fibroids and multinodular goiter. She is familiar to IR from a prior left inferior thyroid  nodule biopsy 08/03/19 with aspiration of a small cyst in the left thyroid . A right mid thyroid  nodule was biopsied 08/26/21. Both nodules biopsied were benign.    She recently saw Dr. Faythe with endocrinology and she expressed concerns that her thyroid  gland is more obvious. She denies neck pain or trouble swallowing but does wake up with a cough at night.    They discussed options for reducing thyroid  nodule volume or removing the thyroid  nodule. They discussed surgery, radioactive iodine therapy, needle aspiration, percutaneous ethanol injection and and radiofrequency ablation. She expressed a desire to learn more about radiofrequency ablation and she presents today for further discussion.    Thyroid  Cosmetic Score:  Readily detected cosmetic problem (Grade 4).   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  The right mid nodule was the most bothersome and given the TIRADS 2 appearance and prior benign pathology the nodule did not require an additional biopsy prior to performing an ablation. We discussed the risks, benefits and procedural expectations of RFA and she was interested in proceeding. We discussed treating the right nodule first with possible treatment of the dominant  left-sided nodule at a later date.   On 08/14/23 she underwent a technically successful thermal ablation of the right mid nodule. She tolerated the procedure well and she recently had a thyroid  US  11/16/23 to assess treatment response. She presents today for follow up via virtual video visit.   She has noticed resolution of her coughing at night.  The cosmetic appearance of her right thyroid  nodule is significantly better.   Her left thyroid  nodule is now the most prominent and she would like to have it treated.  Past Medical History:  Diagnosis Date   Anemia    with last miscarriage   Fibroid    12 cm; with other small ones   Thyroid  atrophy    Nodule   Vaginal Pap smear, abnormal    43 years old   Vitamin D deficiency     Past Surgical History:  Procedure Laterality Date   DILATION AND EVACUATION N/A 10/26/2016   Procedure: DILATATION AND EVACUATION;  Surgeon: Horacio Boas, MD;  Location: [facility];  Service: Gynecology;  Laterality: N/A;   HYSTEROSCOPY N/A 12/30/2016   Procedure: HYSTEROSCOPY;  Surgeon: Yalcinkaya, Tamer, MD;  Location: [facility];  Service: Gynecology;  Laterality: N/A;   IR RADIOLOGIST EVAL & MGMT  07/13/2023   LAPAROSCOPIC GELPORT ASSISTED MYOMECTOMY N/A 12/30/2016   Procedure: Laparascopy, gel port assisted myomectomy, chromotubation, lysis of adhesions, excision of endometriosis,;  Surgeon: Yalcinkaya, Tamer, MD;  Location: [facility];  Service: Gynecology;  Laterality: N/A;    Allergies: Penicillins and Sulfa antibiotics  Medications: Prior to Admission medications   Medication Sig Start Date End Date Taking? Authorizing Provider  Cholecalciferol (VITAMIN D3) 50000 units CAPS Take 1 capsule by mouth once  a week. 07/22/16   [provider]     No family history on file.  Social History   Socioeconomic History   Marital status: Married    Spouse name: Not on file   Number of children: Not on file   Years of  education: Not on file   Highest education level: Not on file  Occupational History   Not on file  Tobacco Use   Smoking status: Never   Smokeless tobacco: Never  Substance and Sexual Activity   Alcohol  use: Yes    Comment: Occ   Drug use: No   Sexual activity: Not on file  Other Topics Concern   Not on file  Social History Narrative   Not on file   Social Drivers of Health   Financial Resource Strain: Not on file  Food Insecurity: Not on file  Transportation Needs: Not on file  Physical Activity: Not on file  Stress: Not on file  Social Connections: Unknown (08/05/2021)   Received from [facility]   Social Network    Social Network: Not on file     Vital Signs: There were no vitals taken for this visit.  Physical Exam  Patient is alert, oriented and able to participate fully in the conversation. No apparent discomfort or distress observed. She appears appropriately dressed.    Imaging:  US  Thyroid  07/08/23           US  Thyroid  11/16/23   Nodule #2  2.47 x 1.99 x 1.8 = 4.4 cc volume. Previously 2.6 x 2.6 x 2.7 cm with a volume of 9.2 cc. 52% reduction in size post-ablation.    Nodule #7 now measures 2.56 x 1.56 x 2.26 compared to prior size of 2.41 x 1.81 x 1.50    Labs:   TFTs - 11/29/14 Serum TSH    1.04  Serum free T4    0.83 Thyroperoxidase Antibody    7   Prior Thyroid  FNA: 08/03/19 - Left inferior nodule 1.6 cm: Bethesda II 08/26/21 - Right mid nodule 1.9 cm: Bethesda II   Assessment and Plan: 43 year old female with a history of benign, symptomatic multinodular thyroid . Her most bothersome nodule is on the right and this was treated with thermal ablation 08/14/23.   After 3 months, the nodule demonstrates ~50% volume reduction.  She has had good symptom relief from this decrease in size.  Her left inferior thyroid  nodule now bothers her the most and she would like to have this ablated.  Plan for left inferior thyroid  nodule radiofrequency  ablation with local anesthesia at [facility].   Ester Sides, MD Pager: [phone]    I spent a total of 25 Minutes in virtual video clinical consultation, greater than 50% of which was counseling/coordinating care for symptomatic thyroid  nodules.

## 2023-11-20 ENCOUNTER — Inpatient Hospital Stay
Admission: RE | Admit: 2023-11-20 | Discharge: 2023-11-20 | Disposition: A | Discharge status: Home / Self Care | Payer: Self-pay | Source: Ambulatory Visit | Attending: Interventional Radiology | Admitting: Interventional Radiology

## 2023-11-20 DIAGNOSIS — E041 Nontoxic single thyroid nodule: Secondary | ICD-10-CM

## 2023-11-20 HISTORY — PX: IR RADIOLOGIST EVAL & MGMT: IMG5224

## 2024-01-25 ENCOUNTER — Encounter: Payer: Self-pay | Admitting: Nurse Practitioner

## 2024-01-27 ENCOUNTER — Other Ambulatory Visit: Payer: Self-pay | Admitting: Medical Genetics

## 2024-02-15 ENCOUNTER — Other Ambulatory Visit: Payer: Self-pay | Admitting: Interventional Radiology

## 2024-02-15 ENCOUNTER — Other Ambulatory Visit: Payer: Self-pay | Admitting: Radiology

## 2024-02-15 DIAGNOSIS — E042 Nontoxic multinodular goiter: Secondary | ICD-10-CM

## 2024-02-16 LAB — SURGICAL PATHOLOGY

## 2024-03-07 ENCOUNTER — Other Ambulatory Visit

## 2024-03-13 NOTE — Progress Notes (Unsigned)
 03/13/2024 Kelsey Reynolds [id number] 1981-04-03   CHIEF COMPLAINT: Rectal bleeding, reflux  HISTORY OF PRESENT ILLNESS: Kelsey Reynolds is a 43 yea old female with a past medical history of anemia, uterine fibroids and thyroid  goiter and benign nodules. She presents to our office today as referred by Dr. Toribio Gander for further evaluation regarding dysphagia and rectal bleeding.  Discussed the use of AI scribe software for clinical note transcription with the patient, who gave verbal consent to proceed.  History of Present Illness Kelsey Reynolds is a 43 year old female with GERD and external hemorrhoids who presents with concerns about rectal bleeding and reflux symptoms.  She requests Dr. Leigh to be her gastroenterologist.  She experiences reflux symptoms, including a severe episode of heartburn approximately a year ago that almost prompted an emergency room visit. Although she has not had similar episodes since, she occasionally wakes up coughing at night, which she suspects might be related to her reflux or goiter. She has not been on any treatment for her reflux despite a previous ENT evaluation. No recent heartburn episodes.  She has experienced rectal bleeding intermittently, with the last episode occurring about a year ago. She reports having external hemorrhoids and notices blood on tissue when wiping but not in the stool. She also reports occasional constipation, which has been an issue since college, but she does not currently use any fiber supplements or laxatives. No recent rectal bleeding is reported.  She has a history of mild anemia and has experienced pancytopenia in the past.  Iron level was low at some point. She noted having a false positive hepatitis C antibody test in the past (negative Hep C RNA). She undergoes annual physicals with routine laboratory studies and is due for her next physical March 2026.   She has a goiter and has  undergone radiofrequency ablation on the right side. She experiences occasional difficulty swallowing, particularly with larger food boluses, which she describes as food going down slowly but not getting stuck. This occurs sporadically and is dependent on the type of food consumed.  Past Medical History:  Diagnosis Date   Anemia    with last miscarriage   Fibroid    12 cm; with other small ones   Thyroid  atrophy    Nodule   Vaginal Pap smear, abnormal    43 years old   Vitamin D deficiency    Past Surgical History:  Procedure Laterality Date   DILATION AND EVACUATION N/A 10/26/2016   Procedure: DILATATION AND EVACUATION;  Surgeon: Horacio Boas, MD;  Location: [facility];  Service: Gynecology;  Laterality: N/A;   HYSTEROSCOPY N/A 12/30/2016   Procedure: HYSTEROSCOPY;  Surgeon: Yalcinkaya, Tamer, MD;  Location[facility];  Service: Gynecology;  Laterality: N/A;   IR RADIOLOGIST EVAL & MGMT  07/13/2023   IR RADIOLOGIST EVAL & MGMT  11/20/2023   LAPAROSCOPIC GELPORT ASSISTED MYOMECTOMY N/A 12/30/2016   Procedure: Laparascopy, gel port assisted myomectomy, chromotubation, lysis of adhesions, excision of endometriosis,;  Surgeon: Yalcinkaya, Tamer, MD;  Location: [facility];  Service: Gynecology;  Laterality: N/A;  Egg retrieval x 3.  Myomectomy.  LEEP x 2.  Left breast biopsy (fibroadenoma).  Thyroid  biopsy x 2 (benign).   Social History: She is a physician, internal medicine with telemedicine.  She is married.  She has 1 daughter.  She drinks 1 glass of Persico on the weekends.  Non-smoker.  No drug use.  Family History: Father with history  of hyperplastic colon polyps, diabetes, hypertension status post kidney transplant secondary to ESRD.   Allergies  Allergen Reactions   Penicillins Hives    chilidhood reaction Has patient had a PCN reaction causing immediate rash, facial/tongue/throat swelling, SOB or lightheadedness with hypotension: Yes Has patient had  a PCN reaction causing severe rash involving mucus membranes or skin necrosis: No Has patient had a PCN reaction that required hospitalization: No Has patient had a PCN reaction occurring within the last 10 years: No If all of the above answers are NO, then may proceed with Cephalosporin use.   Sulfa Antibiotics Hives and Rash    Childhood reaction      Outpatient Encounter Medications as of 03/14/2024  Medication Sig   Cholecalciferol (VITAMIN D3) 50000 units CAPS Take 1 capsule by mouth once a week.   No facility-administered encounter medications on file as of 03/14/2024.    REVIEW OF SYSTEMS:  Gen: Denies fever, sweats or chills. No weight loss.  CV: Denies chest pain, palpitations or edema. Resp: Denies cough, shortness of breath of hemoptysis.  GI: See HPI. GU: Denies urinary burning, blood in urine, increased urinary frequency or incontinence. MS: Denies joint pain, muscles aches or weakness. Derm: Denies rash, itchiness, skin lesions or unhealing ulcers. Psych: Denies depression, anxiety, memory loss or confusion. Heme: Denies bruising, easy bleeding. Neuro:  Denies headaches, dizziness or paresthesias. Endo:  Denies any problems with DM, thyroid  or adrenal function.  PHYSICAL EXAM: BP 118/64   Pulse 74   Ht 5' 10 (1.778 m)   Wt 139 lb 4 oz (63.2 kg)   BMI 19.98 kg/m   General: 43 year old female in no acute distress. Head: Normocephalic and atraumatic. Eyes:  Sclerae non-icteric, conjunctive pink. Ears: Normal auditory acuity. Mouth: Dentition intact. No ulcers or lesions.  Neck: Supple, no lymphadenopathy or thyromegaly.  Lungs: Clear bilaterally to auscultation without wheezes, crackles or rhonchi. Heart: Regular rate and rhythm. No murmur, rub or gallop appreciated.  Abdomen: Soft, nontender, nondistended. No masses. No hepatosplenomegaly. Normoactive bowel sounds x 4 quadrants.  Rectal: Small noninflamed left external hemorrhoids.  Small internal  hemorrhoids without prolapse.  No stool or mass in the rectal vault.  Musculoskeletal: Symmetrical with no gross deformities. Skin: Warm and dry. No rash or lesions on visible extremities. Extremities: No edema. Neurological: Alert oriented x 4, no focal deficits.  Psychological: Alert and cooperative. Normal mood and affect.  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) - EGD benefits and risks discussed including risk with sedation, risk of bleeding, perforation and infection  - Omeprazole 20 mg once daily as needed - Further recommendation to be determined after EGD complete - Patient wishes to schedule EGD 06/2024 - Patient instructed to avoid eating large pieces of meat, bread or rice or other foods she has broccoli which have been problematic in the past - Patient to contact her office if her symptoms worsen prior to her EGD date  Rectal bleeding, small external hemorrhoid - Diagnostic colonoscopy benefits and risks discussed including risk with sedation, risk of bleeding, perforation and infection  - Patient wishes to schedule colonoscopy at time of EGD 06/2024 - Apply a small amount of Desitin inside the anal opening and to the external anal area three times daily as needed for anal or hemorrhoidal irritation/bleeding.  - Fiber 1 tablespoon daily - MiraLAX 1 capful mixed in 8 ounces of clear liquid of choice once daily as needed to avoid constipation -  Patient to have routine laboratory studies including CBC at the time of her next annual physical PCP 06/2024       CC:  Yolande Toribio MATSU, MD

## 2024-03-14 ENCOUNTER — Encounter: Payer: Self-pay | Admitting: Nurse Practitioner

## 2024-03-14 ENCOUNTER — Ambulatory Visit: Admitting: Nurse Practitioner

## 2024-03-14 VITALS — BP 118/64 | HR 74 | Ht 70.0 in | Wt 139.2 lb

## 2024-03-14 DIAGNOSIS — R1310 Dysphagia, unspecified: Secondary | ICD-10-CM

## 2024-03-14 DIAGNOSIS — K625 Hemorrhage of anus and rectum: Secondary | ICD-10-CM

## 2024-03-14 NOTE — Patient Instructions (Addendum)
 We will call you next month to schedule your egd/colon with Dr. Leigh in March  Apply a small amount of Desitin inside the anal opening and to the external anal area three times daily as needed for anal or hemorrhoidal irritation/bleeding.   Take Miralax 1 capful mixed in 8 ounces of water at bed time for constipation as tolerated.  Benfiber one tablespoon once daily to avoid constipation  Drink 64 ounces of water daily   Take over the counter Omeprazole 20mg  once daily as needed for acid reflux   _______________________________________________________  If your blood pressure at your visit was 140/90 or greater, please contact your primary care physician to follow up on this.  _______________________________________________________  If you are age 40 or older, your body mass index should be between 23-30. Your Body mass index is 19.98 kg/m. If this is out of the aforementioned range listed, please consider follow up with your Primary Care Provider.  If you are age 11 or younger, your body mass index should be between 19-25. Your Body mass index is 19.98 kg/m. If this is out of the aformentioned range listed, please consider follow up with your Primary Care Provider.   ________________________________________________________  The [facility] providers would like to encourage you to use MYCHART to communicate with providers for non-urgent requests or questions.  Due to long hold times on the telephone, sending your provider a message by [facility] may be a faster and more efficient way to get a response.  Please allow 48 business hours for a response.  Please remember that this is for non-urgent requests.  _______________________________________________________  [facility] is using a team-based approach to care.  Your team is made up of your doctor and two to three APPS. Our APPS (Nurse Practitioners and Physician Assistants) work with your physician to ensure care  continuity for you. They are fully qualified to address your health concerns and develop a treatment plan. They communicate directly with your gastroenterologist to care for you. Seeing the Advanced Practice Practitioners on your physician's team can help you by facilitating care more promptly, often allowing for earlier appointments, access to diagnostic testing, procedures, and other specialty referrals.

## 2024-03-14 NOTE — Progress Notes (Signed)
 Agree with assessment and plan as outlined.  Thanks, I am happy to see her

## 2024-03-18 ENCOUNTER — Inpatient Hospital Stay
Admission: RE | Admit: 2024-03-18 | Discharge: 2024-03-18 | Disposition: A | Source: Ambulatory Visit | Attending: Interventional Radiology

## 2024-03-18 DIAGNOSIS — E042 Nontoxic multinodular goiter: Secondary | ICD-10-CM

## 2024-03-21 ENCOUNTER — Other Ambulatory Visit

## 2024-03-21 DIAGNOSIS — Z006 Encounter for examination for normal comparison and control in clinical research program: Secondary | ICD-10-CM

## 2024-04-01 LAB — GENECONNECT MOLECULAR SCREEN: Genetic Analysis Overall Interpretation: NEGATIVE

## 2024-04-04 ENCOUNTER — Inpatient Hospital Stay

## 2024-04-04 ENCOUNTER — Inpatient Hospital Stay: Attending: Hematology | Admitting: Genetic Counselor

## 2024-04-04 ENCOUNTER — Encounter: Payer: Self-pay | Admitting: Genetic Counselor

## 2024-04-04 DIAGNOSIS — Z803 Family history of malignant neoplasm of breast: Secondary | ICD-10-CM | POA: Diagnosis not present

## 2024-04-04 LAB — GENETIC SCREENING ORDER

## 2024-04-04 NOTE — Progress Notes (Signed)
 REFERRING PROVIDER: Estelle Service, MD [address] [place],  [place] [address]  PRIMARY PROVIDER:  Yolande Toribio MATSU, MD  PRIMARY REASON FOR VISIT:  1. Family history of malignant neoplasm of breast      HISTORY OF PRESENT ILLNESS:   Ms. McLean-Scocuzza, a 43 y.o. female, was seen for a [facility] cancer genetics consultation at the request of Estelle Service, MD due to a family history of cancer.  Ms. McLean-Scocuzza presents to clinic today to discuss the possibility of a hereditary predisposition to cancer, to discuss genetic testing, and to further clarify her future cancer risks, as well as potential cancer risks for family members.   Ms. Stepney is a 43 y.o. female with no personal history of cancer.    RELEVANT MEDICAL HISTORY:  Menarche was at age 30.  History of two miscarriages and a failed embryo transfer. Three year old daughter via egg retrieval and gestational carrier.   OCP use for approximately 10 years.  Ovaries intact: yes.  Uterus intact: yes.  Menopausal status: premenopausal.  HRT use: 0 years. Colonoscopy: no; planning EGD and colonoscopy in May 2026. History of constipation and intermittent rectal bleeding. Mammogram within the last year: yes. Extremely dense tissue. Follow up breast US  with biopsy.  Number of breast biopsies: 1. Fibroadenoma Up to date with pelvic exams: yes. History LEEP x2 MyRisk 2017 normal  Negative GeneConnect result via research study   Past Medical History:  Diagnosis Date   Anemia    with last miscarriage   Fibroid    12 cm; with other small ones   Goiter    Infertility associated with anovulation    Thyroid  atrophy    Nodule   Vaginal Pap smear, abnormal    43 years old   Vitamin D deficiency     Past Surgical History:  Procedure Laterality Date   DILATION AND EVACUATION N/A 10/26/2016   Procedure: DILATATION AND EVACUATION;  Surgeon: Horacio Boas, MD;  Location: [facility];  Service: Gynecology;   Laterality: N/A;   HYSTEROSCOPY N/A 12/30/2016   Procedure: HYSTEROSCOPY;  Surgeon: Yalcinkaya, Tamer, MD;  Location: [facility];  Service: Gynecology;  Laterality: N/A;   IR RADIOLOGIST EVAL & MGMT  07/13/2023   IR RADIOLOGIST EVAL & MGMT  11/20/2023   LAPAROSCOPIC GELPORT ASSISTED MYOMECTOMY N/A 12/30/2016   Procedure: Laparascopy, gel port assisted myomectomy, chromotubation, lysis of adhesions, excision of endometriosis,;  Surgeon: Yalcinkaya, Tamer, MD;  Location: [facility];  Service: Gynecology;  Laterality: N/A;    Social History   Socioeconomic History   Marital status: Married    Spouse name: Not on file   Number of children: Not on file   Years of education: Not on file   Highest education level: Not on file  Occupational History   Not on file  Tobacco Use   Smoking status: Never   Smokeless tobacco: Never  Substance and Sexual Activity   Alcohol  use: Yes    Comment: Occ   Drug use: No   Sexual activity: Not on file  Other Topics Concern   Not on file  Social History Narrative   Not on file   Social Drivers of Health   Tobacco Use: Low Risk (03/14/2024)   Patient History    Smoking Tobacco Use: Never    Smokeless Tobacco Use: Never    Passive Exposure: Not on file  Financial Resource Strain: Not on file  Food Insecurity: Not on file  Transportation  Needs: Not on file  Physical Activity: Not on file  Stress: Not on file  Social Connections: Not on file  Depression (EYV7-0): Not on file  Alcohol  Screen: Not on file  Housing: Not on file  Utilities: Not on file  Health Literacy: Not on file     FAMILY HISTORY:  We obtained a detailed, 4-generation family history.  Significant diagnoses are listed below: Family History  Problem Relation Age of Onset   Breast cancer Mother    Colon polyps Father    Ovarian cancer Maternal Aunt 35 - 59   Esophageal cancer Maternal Grandfather    Lung cancer Paternal Grandmother    Pancreatic  cancer Other        maternal great uncle   Endometrial cancer Maternal Cousin 34 - 38    Ms. McLean-Scocuzza is unaware of previous family history of genetic testing for hereditary cancer risks There is no reported Ashkenazi Jewish ancestry.    GENETIC COUNSELING ASSESSMENT: Ms. Jenne is a 43 y.o. female with a family history of cancer which is somewhat suggestive of a hereditary predisposition to cancer given the family history of a first degree relative with breast cancer under 50 and third degree relatives with ovarian and pancreatic cancer. We, therefore, discussed and recommended the following at today's visit.   DISCUSSION: We discussed that 5 - 10% of cancer is hereditary, with most cases of hereditary breast cancer associated with pathogenic variants in BRCA1/2.  There are other genes that can be associated with hereditary breast cancer syndromes.  We discussed that testing is beneficial for several reasons, including knowing about other cancer risks, identifying potential screening and risk-reduction options that may be appropriate, and to understanding if other family members could be at risk for cancer and allowing them to undergo genetic testing.  We reviewed the characteristics, features and inheritance patterns of hereditary cancer syndromes. We also discussed genetic testing, including the appropriate family members to test, the process of testing, insurance coverage and turn-around-time for results. We discussed the implications of a negative, positive, carrier and/or variant of uncertain significant result. We discussed that negative results would be uninformative given that Ms. McLean-Scocuzza does not have a personal history of cancer. We recommended Ms. McLean-Scocuzza pursue genetic testing for a panel that contains genes associated with breast, ovarian, pancreatic, and endometrial cancers. We reviewed that given previous normal testing, yield is expected to be low.  However, update testing will include a few additional genes associated with breast cancer, genes associated with other cancer types and will include RNA analysis.   Ms. McLean-Scocuzza was offered a common hereditary cancer panel (40+ genes) and an expanded pan-cancer panel (70+ genes). Ms. McLean-Scocuzza was informed of the benefits and limitations of each panel, including that expanded pan-cancer panels contain several genes that do not have clear management guidelines at this point in time.  We also discussed that as the number of genes included on a panel increases, the chances of variants of uncertain significance increases.  After considering the benefits and limitations of each gene panel, Ms. McLean-Scocuzza elected to have [facility] CancerNext-Expanded +RNAinsight panel. The CancerNext-Expanded gene panel offered by [facility] and includes sequencing, rearrangement, and RNA analysis for the following 77 genes: AIP, ALK, APC, ATM, AXIN2, BAP1, BARD1, BMPR1A, BRCA1, BRCA2, BRIP1, CDC73, CDH1, CDK4, CDKN1B, CDKN2A, CEBPA, CHEK2, CTNNA1, DDX41, DICER1, EGFR, EPCAM, ETV6, FH, FLCN, GATA2, GREM1, HOXB13, KIT, LZTR1, MAX, MBD4, MEN1, MET, MITF, MLH1, MSH2, MSH3, MSH6, MUTYH, NF1, NF2, NTHL1, PALB2, PDGFRA,  PHOX2B, PMS2, POLD1, POLE, POT1, PRKAR1A, PTCH1, PTEN, RAD51C, RAD51D, RB1, RET, RPS20, RUNX1, SDHA, SDHAF2, SDHB, SDHC, SDHD, SMAD4, SMARCA4, SMARCB1, SMARCE1, STK11, SUFU, TMEM127, TP53, TSC1, TSC2, VHL, WT1.   Based on Ms. McLean-Scocuzza's family history of cancer, she meets medical criteria for genetic testing. Though Ms. McLean-Scocuzza is not personally affected, there are no affected family members that are willing/able to undergo hereditary cancer testing. Despite that she meets criteria, she may still have an out of pocket cost. We discussed that if her out of pocket cost for testing is over $100, the laboratory should contact them to discuss self-pay prices, patient pay assistance programs, if  applicable, and other billing options.  Discussed that given history of extremely dense breast tissue and family history, breast cancer risk model likely to be over 20%. Will calculate with return of results and discuss at that time. Ms. McLean-Scocuzza has previously discussed option for annual breast MRI with her OBGYN, but is unsure if she would like to proceed with this option given her concerns for contrast every year.   We discussed that some people do not want to undergo genetic testing due to fear of genetic discrimination.  A federal law called the Genetic Information Non-Discrimination Act (GINA) of 2008 helps protect individuals against genetic discrimination based on their genetic test results.  It impacts both health insurance and employment.  With health insurance, it protects against increased premiums, being kicked off insurance or being forced to take a test in order to be insured.  For employment it protects against hiring, firing and promoting decisions based on genetic test results.  GINA does not apply to those in the [employer], those who work for companies with less than 15 employees, and new life insurance or long-term disability insurance policies.  Health status due to a cancer diagnosis is not protected under GINA.  PLAN: After considering the risks, benefits, and limitations, Ms. McLean-Scocuzza provided informed consent to pursue genetic testing and the blood sample was sent to [facility] for analysis of the CancerNext-Expanded +RNAinsight. Results should be available within approximately 2-3 weeks' time, at which point they will be disclosed by telephone to Ms. McLean-Scocuzza, as will any additional recommendations warranted by these results. Ms. McLean-Scocuzza will receive a summary of her genetic counseling visit and a copy of her results once available. This information will also be available in Epic.   Based on Ms. McLean-Scocuzza's family history, we recommended her  maternal aunt and maternal cousin, both diagnosed with cancer, have genetic counseling and testing. Ms. McLean-Scocuzza will let us  know if we can be of any assistance in coordinating genetic counseling and/or testing for this family member.   Lastly, we encouraged Ms. McLean-Scocuzza to remain in contact with cancer genetics annually so that we can continuously update the family history and inform her of any changes in cancer genetics and testing that may be of benefit for this family.   Ms. McLean-Scocuzza's questions were answered to her satisfaction today. Our contact information was provided should additional questions or concerns arise. Thank you for the referral and allowing us  to share in the care of your patient.   Burnard Ogren, MS, [facility] Licensed, [email or web address] phone: [phone]   65 minutes were spent on the date of the encounter in service to the patient including preparation, face-to-face consultation, documentation and care coordination.  The patient was seen alone.  Drs. Gudena and/or Lanny were available to discuss this case as needed.  _______________________________________________________________________ For Office Staff:  Number of people involved in session: 21 Was an Intern/ student involved with case: no

## 2024-04-12 ENCOUNTER — Telehealth: Payer: Self-pay | Admitting: Genetic Counselor

## 2024-04-12 NOTE — Telephone Encounter (Signed)
 LVM asking for call back to review results of testing

## 2024-04-13 ENCOUNTER — Ambulatory Visit: Payer: Self-pay | Admitting: Genetic Counselor

## 2024-04-13 DIAGNOSIS — Z1379 Encounter for other screening for genetic and chromosomal anomalies: Secondary | ICD-10-CM | POA: Insufficient documentation

## 2024-04-13 NOTE — Telephone Encounter (Signed)
 I spoke to Kelsey Reynolds to review results of genetic testing, completed with [facility] CancerNext-Expanded +RNA. Testing did not identify any variants known to increase the risk for cancer.  Discussed that we do not know why there is cancer in the family. It is possible that the cancers in history occurred by chance or due to environmental factors. It is possible there are family members that have a variant that she did not inherit. It is also possible there is a hereditary cause for cancer that cannot be identified with current technology or due to a gene that was not tested. It will be important for her to keep in contact with genetics to keep up with whether additional testing may be needed.  Please see counseling note for further detail on this result.

## 2024-04-13 NOTE — Progress Notes (Signed)
 HPI:  Ms. McLean-Scocuzza was previously seen in the [facility] due to a family history of cancer and concerns regarding a hereditary predisposition to cancer. Please refer to our prior cancer genetics clinic note for more information regarding our discussion, assessment and recommendations, at the time. Ms. McLean-Scocuzza's recent genetic test results were disclosed to her, as were recommendations warranted by these results. These results and recommendations are discussed in more detail below.  Results were disclosed via telephone on 04/13/24.   FAMILY HISTORY:  We obtained a detailed, 4-generation family history.  Significant diagnoses are listed below: Family History  Problem Relation Age of Onset   Breast cancer Mother    Colon polyps Father    Ovarian cancer Maternal Aunt 64 - 59   Esophageal cancer Maternal Grandfather    Lung cancer Paternal Grandmother    Pancreatic cancer Other        maternal great uncle   Endometrial cancer Maternal Cousin 70 - 96    Ms. McLean-Scocuzza is unaware of previous family history of genetic testing for hereditary cancer risks There is no reported Ashkenazi Jewish ancestry.      GENETIC TEST RESULTS: Genetic testing reported out on 04/11/24 through the [facility] CancerNext-Expanded +RNAinsight panel found no pathogenic mutations. The CancerNext-Expanded gene panel offered by [facility] and includes sequencing, rearrangement, and RNA analysis for the following 77 genes: AIP, ALK, APC, ATM, AXIN2, BAP1, BARD1, BMPR1A, BRCA1, BRCA2, BRIP1, CDC73, CDH1, CDK4, CDKN1B, CDKN2A, CEBPA, CHEK2, CTNNA1, DDX41, DICER1, EGFR, EPCAM, ETV6, FH, FLCN, GATA2, GREM1, HOXB13, KIT, LZTR1, MAX, MBD4, MEN1, MET, MITF, MLH1, MSH2, MSH3, MSH6, MUTYH, NF1, NF2, NTHL1, PALB2, PDGFRA, PHOX2B, PMS2, POLD1, POLE, POT1, PRKAR1A, PTCH1, PTEN, RAD51C, RAD51D, RB1, RET, RPS20, RUNX1, SDHA, SDHAF2, SDHB, SDHC, SDHD, SMAD4, SMARCA4, SMARCB1, SMARCE1, STK11, SUFU,  TMEM127, TP53, TSC1, TSC2, VHL, WT1. The test report has been scanned into EPIC and is located under the Molecular Pathology section of the Results Review tab.  A portion of the result report is included below for reference.     We discussed with Ms. McLean-Scocuzza that because current genetic testing is not perfect, it is possible there may be a gene mutation in one of these genes that current testing cannot detect, but that chance is small.  We also discussed, that there could be another gene that has not yet been discovered, or that we have not yet tested, that is responsible for the cancer diagnoses in the family. It is also possible there is a hereditary cause for the cancer in the family that Ms. McLean-Scocuzza did not inherit and therefore was not identified in her testing.  Therefore, it is important to remain in touch with cancer genetics in the future so that we can continue to offer Ms. McLean-Scocuzza the most up to date genetic testing.   ADDITIONAL GENETIC TESTING: We discussed with Ms. McLean-Scocuzza that her genetic testing was fairly extensive.  If there are genes identified to increase cancer risk that can be analyzed in the future, we would be happy to discuss and coordinate this testing at that time.    CANCER SCREENING RECOMMENDATIONS: Ms. McLean-Scocuzza's test result is considered negative (normal).  This means that we have not identified a hereditary cause for her family history of cancer at this time. Most cancers happen by chance and this negative test suggests that her family history of cancer may fall into this category.    Possible reasons for Ms. McLean-Scocuzza's negative genetic test include:  1.  There may be a gene mutation in one of these genes that current testing methods cannot detect but that chance is small.  2. There could be another gene that has not yet been discovered, or that we have not yet tested, that is responsible for the cancer diagnoses in the family.   3.  There may be no hereditary risk for cancer in the family. The cancers in Ms. McLean-Scocuzza and/or her family may be sporadic/familial or due to other genetic and environmental factors. 4. It is also possible there is a hereditary cause for the cancer in the family that Ms. McLean-Scocuzza did not inherit.  Therefore, it is recommended she continue to follow the cancer management and screening guidelines provided by her primary healthcare provider. An individual's cancer risk and medical management are not determined by genetic test results alone. Overall cancer risk assessment incorporates additional factors, including personal medical history, family history, and any available genetic information that may result in a personalized plan for cancer prevention and surveillance  Given Ms. McLean-Scocuzza's family history, we must interpret these negative results with some caution.  Families with features suggestive of hereditary risk for cancer tend to have multiple family members with cancer, diagnoses in multiple generations and diagnoses before the age of 61. Ms. McLean-Scocuzza's family exhibits some of these features. Thus, this result may simply reflect our current inability to detect all mutations within these genes or there may be a different gene that has not yet been discovered or tested.   Ms. McLean-Scocuzza has been determined to be at high risk for breast cancer. Her Tyrer-Cuzick risk score is 29%.  For women with a greater than 20% lifetime risk of breast cancer, the [employer] (NCCN) recommends the following:   Clinical encounter every 6-12 months to begin when identified as being at increased risk, but not before age 36  Annual mammograms. Tomosynthesis is recommended starting 10 years earlier than the youngest breast cancer diagnosis in the family or at age 15 (whichever comes first), but not before age 41   Annual breast MRI starting 10 years earlier than  the youngest breast cancer diagnosis in the family or at age 29 (whichever comes first), but not before age 32.   We, therefore, discussed that it is reasonable for Ms. McLean-Scocuzza to be followed by a high-risk breast cancer clinic; in addition to a yearly mammogram and physical exam by a healthcare provider, she should discuss the usefulness of an annual breast MRI with the high-risk clinic providers.  Ms. McLean-Scocuzza reports she has been referred to the [facility] at [facility] and plans to follow up with that office.    RECOMMENDATIONS FOR FAMILY MEMBERS:  Individuals in this family might be at some increased risk of developing cancer, over the general population risk, simply due to the family history of cancer.  We recommended women in this family have a yearly mammogram beginning at age 25, or 36 years younger than the earliest onset of cancer, an annual clinical breast exam, and perform monthly breast self-exams. Women in this family should also have a gynecological exam as recommended by their primary provider. All family members should be referred for colonoscopy starting at age 62, or 63 years younger than the earliest onset of cancer.  It is also possible there is a hereditary cause for the cancer in Ms. McLean-Scocuzza's family that she did not inherit and therefore was not identified in her.  Based on Ms. McLean-Scocuzza's family history, we recommended  her family members diagnosed with cancer have genetic counseling and testing. Ms. McLean-Scocuzza will let us  know if we can be of any assistance in coordinating genetic counseling and/or testing for this family member.   FOLLOW-UP: Lastly, we discussed with Ms. McLean-Scocuzza that cancer genetics is a rapidly advancing field and it is possible that new genetic tests will be appropriate for her and/or her family members in the future. We encouraged her to remain in contact with cancer genetics on an annual basis so we can update her  personal and family histories and let her know of advances in cancer genetics that may benefit this family.   Our contact number was provided. Ms. McLean-Scocuzza's questions were answered to her satisfaction, and she knows she is welcome to call us  at anytime with additional questions or concerns.   Burnard Ogren, MS, [facility] Licensed, [email or web address] [phone]

## 2024-04-20 ENCOUNTER — Telehealth: Payer: Self-pay | Admitting: Genetic Counselor

## 2024-04-20 NOTE — Telephone Encounter (Signed)
 Received call from [place] with questions about genetic testing for their child. Kelsey Reynolds had a negative CancerNext-Expanded panel, resulted 04/11/24. 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 He is currently being worked up for a retroperitoneal mass, with sarcoma on the possible differential. Kelsey Reynolds was wondering if this would change our understanding of the RB1 VUS and if her daughter should be tested for that.   Spouse seen by Darice Monte, MS [facility]. Recommendation based on his result was that we do not use VUS to change management recommendations or identify at risk family members. At this time, we do not know if a VUS increases the risk for RB1 related disease or if it is just normal variability within the gene. Identification of the variant in her daughter would not provide helpful information with making medical decisions. Recommend waiting for more data on VUS or with confirmed diagnoses from family history. We would recontact the family if more information is obtained on this VUS, but that most end up being reclassified as benign.   Kelsey Reynolds expressed understanding. No further questions at this time.

## 2024-05-03 ENCOUNTER — Telehealth: Payer: Self-pay

## 2024-05-03 NOTE — Telephone Encounter (Signed)
 Called and LM for patient to call back to be scheduled for [facility] with [employer] in March. She will need a PV 2 weeks prior (dx: dysphagia, rectal bleeding)

## 2024-05-03 NOTE — Telephone Encounter (Signed)
-----   Message from [facility] Sonny ORN sent at 03/14/2024  9:23 AM EST ----- Patient needs ECL with [employer] (per [place]) in [facility] in March

## 2024-05-03 NOTE — Telephone Encounter (Signed)
 Patient states she would like to wait until May to have procedures. Will call back in March to schedule for then
